# Patient Record
Sex: Male | Born: 1950
Health system: Southern US, Community
[De-identification: ages and names within clinical notes are randomized; demographics above are authoritative.]

## PROBLEM LIST (undated history)

## (undated) DIAGNOSIS — J449 Chronic obstructive pulmonary disease, unspecified: Secondary | ICD-10-CM

## (undated) DIAGNOSIS — E7800 Pure hypercholesterolemia, unspecified: Secondary | ICD-10-CM

## (undated) DIAGNOSIS — I10 Essential (primary) hypertension: Secondary | ICD-10-CM

## (undated) DIAGNOSIS — G4730 Sleep apnea, unspecified: Secondary | ICD-10-CM

## (undated) HISTORY — DX: Pure hypercholesterolemia, unspecified: E78.00

---

## 2000-08-06 ENCOUNTER — Ambulatory Visit: Admission: RE | Admit: 2000-08-06 | Discharge: 2000-08-06 | Payer: Self-pay | Admitting: Internal Medicine

## 2000-10-12 ENCOUNTER — Ambulatory Visit: Admission: RE | Admit: 2000-10-12 | Discharge: 2000-10-12 | Payer: Self-pay | Admitting: Pulmonary Disease

## 2003-01-19 ENCOUNTER — Ambulatory Visit (HOSPITAL_COMMUNITY): Admission: RE | Admit: 2003-01-19 | Discharge: 2003-01-19 | Payer: Self-pay | Admitting: Internal Medicine

## 2013-05-15 ENCOUNTER — Telehealth: Payer: Self-pay

## 2013-05-15 NOTE — Telephone Encounter (Signed)
Gastroenterology Pre-Procedure Review  Request Date: Requesting Physician: Dr. Willey Blade  PATIENT REVIEW QUESTIONS: The patient responded to the following health history questions as indicated:    1. Diabetes Melitis: NO 2. Joint replacements in the past 12 months: NO 3. Major health problems in the past 3 months: NO 4. Has an artificial valve or MVP: NO 5. Has a defibrillator: NO 6. Has been advised in past to take antibiotics in advance of a procedure like teeth cleaning: NO 7. No family history of colon cancer 8. Alcohol: 6 pack in a week  9. No bleeding or hemorrhoids  MEDICATIONS & ALLERGIES:     NKDA  Patient reports the following regarding taking any blood thinners:   Plavix? NO Aspirin? NO Coumadin? NO  Patient confirms/reports the following medications:  Current Outpatient Prescriptions  Medication Sig Dispense Refill  . amLODipine (NORVASC) 10 MG tablet Take 10 mg by mouth daily.      Marland Kitchen atenolol (TENORMIN) 25 MG tablet Take 25 mg by mouth daily.      . fenofibrate 160 MG tablet Take 160 mg by mouth daily.       No current facility-administered medications for this visit.    Patient confirms/reports the following allergies:  Allergies not on file  No orders of the defined types were placed in this encounter.    AUTHORIZATION INFORMATION Primary Insurance: [facility]  ID #: [id number]  Group #: [id number] Pre-Cert / Josem Kaufmann required:  Pre-Cert / Josem Kaufmann #:   SCHEDULE INFORMATION: Procedure has been scheduled as follows:  Date:  Time:  Location:  This Gastroenterology Pre-Precedure Review Form is being routed to the following provider(s):

## 2013-05-19 ENCOUNTER — Other Ambulatory Visit: Payer: Self-pay

## 2013-05-19 ENCOUNTER — Encounter (HOSPITAL_COMMUNITY): Payer: Self-pay | Admitting: Pharmacy Technician

## 2013-05-19 DIAGNOSIS — Z1211 Encounter for screening for malignant neoplasm of colon: Secondary | ICD-10-CM

## 2013-05-19 MED ORDER — PEG-KCL-NACL-NASULF-NA ASC-C 100 G PO SOLR: 1.0000 | ORAL | Status: DC

## 2013-05-19 NOTE — Telephone Encounter (Signed)
Ok to schedule.

## 2013-05-19 NOTE — Telephone Encounter (Signed)
Pt called and is scheduled for 05/26/2013 at 1:00 PM with Dr. Gala Romney. He is aware he will need to be there at 12:00 noon to register at [place]. Routing to Neil Crouch, PA to sign off on.

## 2013-05-20 ENCOUNTER — Telehealth: Payer: Self-pay

## 2013-05-20 MED ORDER — PEG-KCL-NACL-NASULF-NA ASC-C 100 G PO SOLR: 1.0000 | ORAL | Status: DC

## 2013-05-20 NOTE — Telephone Encounter (Signed)
I called [employer] at [phone] and spoke to Jesus G, who said that a PA is not required for a screening colonoscopy.   Reference # [id number].

## 2013-05-20 NOTE — Telephone Encounter (Signed)
Rx sent to the pharmacy and instructions faxed to pharmacy.

## 2013-05-26 ENCOUNTER — Ambulatory Visit (HOSPITAL_COMMUNITY)
Admission: RE | Admit: 2013-05-26 | Discharge: 2013-05-26 | Disposition: A | Discharge status: Home / Self Care | Payer: 59 | Source: Ambulatory Visit | Attending: Internal Medicine | Admitting: Internal Medicine

## 2013-05-26 ENCOUNTER — Encounter (HOSPITAL_COMMUNITY): Payer: Self-pay | Admitting: *Deleted

## 2013-05-26 ENCOUNTER — Encounter (HOSPITAL_COMMUNITY)
Admission: RE | Disposition: A | Discharge status: Home / Self Care | Payer: Self-pay | Source: Ambulatory Visit | Attending: Internal Medicine

## 2013-05-26 DIAGNOSIS — D126 Benign neoplasm of colon, unspecified: Secondary | ICD-10-CM | POA: Insufficient documentation

## 2013-05-26 DIAGNOSIS — Z79899 Other long term (current) drug therapy: Secondary | ICD-10-CM | POA: Insufficient documentation

## 2013-05-26 DIAGNOSIS — I10 Essential (primary) hypertension: Secondary | ICD-10-CM | POA: Insufficient documentation

## 2013-05-26 DIAGNOSIS — K5730 Diverticulosis of large intestine without perforation or abscess without bleeding: Secondary | ICD-10-CM | POA: Insufficient documentation

## 2013-05-26 DIAGNOSIS — Z1211 Encounter for screening for malignant neoplasm of colon: Secondary | ICD-10-CM | POA: Insufficient documentation

## 2013-05-26 HISTORY — PX: COLONOSCOPY: SHX5424

## 2013-05-26 HISTORY — DX: Essential (primary) hypertension: I10

## 2013-05-26 SURGERY — COLONOSCOPY
Anesthesia: Moderate Sedation

## 2013-05-26 MED ORDER — MIDAZOLAM HCL 5 MG/5ML IJ SOLN
INTRAMUSCULAR | Status: DC | PRN
  Administered 2013-05-26 (×2): 2 mg via INTRAVENOUS

## 2013-05-26 MED ORDER — ONDANSETRON HCL 4 MG/2ML IJ SOLN
INTRAMUSCULAR | Status: DC | PRN
  Administered 2013-05-26: 4 mg via INTRAVENOUS

## 2013-05-26 MED ORDER — MEPERIDINE HCL 100 MG/ML IJ SOLN
INTRAMUSCULAR | Status: DC | PRN
  Administered 2013-05-26: 50 mg via INTRAVENOUS
  Administered 2013-05-26: 25 mg via INTRAVENOUS

## 2013-05-26 MED ORDER — MIDAZOLAM HCL 5 MG/5ML IJ SOLN
INTRAMUSCULAR | Status: AC
  Filled 2013-05-26: qty 10

## 2013-05-26 MED ORDER — SIMETHICONE 40 MG/0.6ML PO SUSP
ORAL | Status: DC | PRN
  Administered 2013-05-26: 11:00:00

## 2013-05-26 MED ORDER — MEPERIDINE HCL 100 MG/ML IJ SOLN
INTRAMUSCULAR | Status: AC
  Filled 2013-05-26: qty 2

## 2013-05-26 MED ORDER — ONDANSETRON HCL 4 MG/2ML IJ SOLN
INTRAMUSCULAR | Status: AC
  Filled 2013-05-26: qty 2

## 2013-05-26 MED ORDER — SODIUM CHLORIDE 0.9 % IV SOLN
INTRAVENOUS | Status: DC
  Administered 2013-05-26: 1000 mL via INTRAVENOUS

## 2013-05-26 NOTE — Discharge Instructions (Addendum)
°Colonoscopy °Discharge Instructions ° °Read the instructions outlined below and refer to this sheet in the next few weeks. These discharge instructions provide you with general information on caring for yourself after you leave the hospital. Your doctor may also give you specific instructions. While your treatment has been planned according to the most current medical practices available, unavoidable complications occasionally occur. If you have any problems or questions after discharge, call Dr. Rourk at [phone]. °ACTIVITY °· You may resume your regular activity, but move at a slower pace for the next 24 hours.  °· Take frequent rest periods for the next 24 hours.  °· Walking will help get rid of the air and reduce the bloated feeling in your belly (abdomen).  °· No driving for 24 hours (because of the medicine (anesthesia) used during the test).   °· Do not sign any important legal documents or operate any machinery for 24 hours (because of the anesthesia used during the test).  °NUTRITION °· Drink plenty of fluids.  °· You may resume your normal diet as instructed by your doctor.  °· Begin with a light meal and progress to your normal diet. Heavy or fried foods are harder to digest and may make you feel sick to your stomach (nauseated).  °· Avoid alcoholic beverages for 24 hours or as instructed.  °MEDICATIONS °· You may resume your normal medications unless your doctor tells you otherwise.  °WHAT YOU CAN EXPECT TODAY °· Some feelings of bloating in the abdomen.  °· Passage of more gas than usual.  °· Spotting of blood in your stool or on the toilet paper.  °IF YOU HAD POLYPS REMOVED DURING THE COLONOSCOPY: °· No aspirin products for 7 days or as instructed.  °· No alcohol for 7 days or as instructed.  °· Eat a soft diet for the next 24 hours.  °FINDING OUT THE RESULTS OF YOUR TEST °Not all test results are available during your visit. If your test results are not back during the visit, make an appointment  with your caregiver to find out the results. Do not assume everything is normal if you have not heard from your caregiver or the medical facility. It is important for you to follow up on all of your test results.  °SEEK IMMEDIATE MEDICAL ATTENTION IF: °· You have more than a spotting of blood in your stool.  °· Your belly is swollen (abdominal distention).  °· You are nauseated or vomiting.  °· You have a temperature over 101.   °· You have abdominal pain or discomfort that is severe or gets worse throughout the day.  ° °. Diverticulosis and polyp information provided ° °Further recommendations to follow pending review of pathology report ° ° °Diverticulosis °Diverticulosis is a common condition that develops when small pouches (diverticula) form in the wall of the colon. The risk of diverticulosis increases with age. It happens more often in people who eat a low-fiber diet. Most individuals with diverticulosis have no symptoms. Those individuals with symptoms usually experience abdominal pain, constipation, or loose stools (diarrhea). °HOME CARE INSTRUCTIONS  °· Increase the amount of fiber in your diet as directed by your caregiver or dietician. This may reduce symptoms of diverticulosis. °· Your caregiver may recommend taking a dietary fiber supplement. °· Drink at least 6 to 8 glasses of water each day to prevent constipation. °· Try not to strain when you have a bowel movement. °· Your caregiver may recommend avoiding nuts and seeds to prevent complications, although   this is still an uncertain benefit. °· Only take over-the-counter or prescription medicines for pain, discomfort, or fever as directed by your caregiver. °FOODS WITH HIGH FIBER CONTENT INCLUDE: °· Fruits. Apple, peach, pear, tangerine, raisins, prunes. °· Vegetables. Brussels sprouts, asparagus, broccoli, cabbage, carrot, cauliflower, romaine lettuce, spinach, summer squash, tomato, winter squash, zucchini. °· Starchy Vegetables. Baked beans,  kidney beans, lima beans, split peas, lentils, potatoes (with skin). °· Grains. Whole wheat bread, brown rice, bran flake cereal, plain oatmeal, white rice, shredded wheat, bran muffins. °SEEK IMMEDIATE MEDICAL CARE IF:  °· You develop increasing pain or severe bloating. °· You have an oral temperature above 102° F (38.9° C), not controlled by medicine. °· You develop vomiting or bowel movements that are bloody or black. °Document Released: 12/09/2003 Document Revised: 06/05/2011 Document Reviewed: 08/11/2009 °[employer]® Patient Information ©2014 [employer]. °Colon Polyps °Polyps are lumps of extra tissue growing inside the body. Polyps can grow in the large intestine (colon). Most colon polyps are noncancerous (benign). However, some colon polyps can become cancerous over time. Polyps that are larger than a pea may be harmful. To be safe, caregivers remove and test all polyps. °CAUSES  °Polyps form when mutations in the genes cause your cells to grow and divide even though no more tissue is needed. °RISK FACTORS °There are a number of risk factors that can increase your chances of getting colon polyps. They include: °· Being older than 50 years. °· Family history of colon polyps or colon cancer. °· Long-term colon diseases, such as colitis or Crohn disease. °· Being overweight. °· Smoking. °· Being inactive. °· Drinking too much alcohol. °SYMPTOMS  °Most small polyps do not cause symptoms. If symptoms are present, they may include: °· Blood in the stool. The stool may look dark red or black. °· Constipation or diarrhea that lasts longer than 1 week. °DIAGNOSIS °People often do not know they have polyps until their caregiver finds them during a regular checkup. Your caregiver can use 4 tests to check for polyps: °· Digital rectal exam. The caregiver wears gloves and feels inside the rectum. This test would find polyps only in the rectum. °· Barium enema. The caregiver puts a liquid called barium into your rectum  before taking X-rays of your colon. Barium makes your colon look white. Polyps are dark, so they are easy to see in the X-ray pictures. °· Sigmoidoscopy. A thin, flexible tube (sigmoidoscope) is placed into your rectum. The sigmoidoscope has a light and tiny camera in it. The caregiver uses the sigmoidoscope to look at the last third of your colon. °· Colonoscopy. This test is like sigmoidoscopy, but the caregiver looks at the entire colon. This is the most common method for finding and removing polyps. °TREATMENT  °Any polyps will be removed during a sigmoidoscopy or colonoscopy. The polyps are then tested for cancer. °PREVENTION  °To help lower your risk of getting more colon polyps: °· Eat plenty of fruits and vegetables. Avoid eating fatty foods. °· Do not smoke. °· Avoid drinking alcohol. °· Exercise every day. °· Lose weight if recommended by your caregiver. °· Eat plenty of calcium and folate. Foods that are rich in calcium include milk, cheese, and broccoli. Foods that are rich in folate include chickpeas, kidney beans, and spinach. °HOME CARE INSTRUCTIONS °Keep all follow-up appointments as directed by your caregiver. You may need periodic exams to check for polyps. °SEEK MEDICAL CARE IF: °You notice bleeding during a bowel movement. °Document Released: 12/08/2003 Document Revised: 06/05/2011   Document Reviewed: 05/23/2011 [facility] Patient Information 2014 [place].

## 2013-05-26 NOTE — Op Note (Signed)
[place], [id number]   COLONOSCOPY PROCEDURE REPORT  PATIENT: Joseph Peterson, Joseph Peterson  MR#:         [id number] BIRTHDATE: [birth year] , 61  yrs. old GENDER: Male ENDOSCOPIST: R.  Garfield Cornea, MD FACP FACG REFERRED BY:  Asencion Noble, M.D. PROCEDURE DATE:  05/26/2013 PROCEDURE:     Ileocolonoscopy with snare polypectomy  INDICATIONS: Average risk colon cancer screening examination  INFORMED CONSENT:  The risks, benefits, alternatives and imponderables including but not limited to bleeding, perforation as well as the possibility of a missed lesion have been reviewed.  The potential for biopsy, lesion removal, etc. have also been discussed.  Questions have been answered.  All parties agreeable. Please see the history and physical in the medical record for more information.  MEDICATIONS: Versed 4 mg IV and Demerol 75 mg IV in divided doses. Zofran 4 mg IV  DESCRIPTION OF PROCEDURE:  After a digital rectal exam was performed, the EC-3890Li ([id number])  colonoscope was advanced from the anus through the rectum and colon to the area of the cecum, ileocecal valve and appendiceal orifice.  The cecum was deeply intubated.  These structures were well-seen and photographed for the record.  From the level of the cecum and ileocecal valve, the scope was slowly and cautiously withdrawn.  The mucosal surfaces were carefully surveyed utilizing scope tip deflection to facilitate fold flattening as needed.  The scope was pulled down into the rectum where a thorough examination including retroflexion was performed.    FINDINGS:  Adequate preparation. Normal rectum. Scattered left-sided diverticula; (2) 5 mm polyps in the base of the cecum; otherwise, the remainder of the colonic mucosa appeared normal. The distal 5 cm of terminal ileal mucosa also appeared normal.  THERAPEUTIC / DIAGNOSTIC MANEUVERS PERFORMED:  The above-mentioned polyps were cold snare  removed  COMPLICATIONS: None  CECAL WITHDRAWAL TIME:  14 minutes  IMPRESSION:  Colonic diverticulosis. Colonic polyps-removed as described above  RECOMMENDATIONS: Followup on pathology.   _______________________________ eSigned:  R. Garfield Cornea, MD FACP [facility] 05/26/2013 12:02 PM   CC:    PATIENT NAME:  Joseph Peterson, Joseph Peterson MR#: [id number]

## 2013-05-26 NOTE — H&P (Signed)
  Primary Care Physician:  Asencion Noble, MD Primary Gastroenterologist:  Dr. Gala Romney  Pre-Procedure History & Physical: HPI:  Joseph Peterson is a 63 y.o. male is here for a screening colonoscopy. Last colonoscopy 10 years ago-reportedly negative. No bowel symptoms. No family history of colon cancer or polyps.  Past Medical History  Diagnosis Date  . Hypertension     History reviewed. No pertinent past surgical history.  Prior to Admission medications   Medication Sig Start Date End Date Taking? Authorizing Provider  amLODipine (NORVASC) 10 MG tablet Take 10 mg by mouth daily.   Yes Historical Provider, MD  atenolol (TENORMIN) 25 MG tablet Take 25 mg by mouth daily.   Yes Historical Provider, MD  fenofibrate 160 MG tablet Take 160 mg by mouth daily.   Yes Historical Provider, MD  peg 3350 powder (MOVIPREP) 100 G SOLR Take 1 kit (200 g total) by mouth as directed. 05/20/13   Daneil Dolin, MD    Allergies as of 05/19/2013  . (No Known Allergies)    History reviewed. No pertinent family history.  History   Social History  . Marital Status: Married    Spouse Name: N/A    Number of Children: N/A  . Years of Education: N/A   Occupational History  . Not on file.   Social History Main Topics  . Smoking status: Former Research scientist (life sciences)  . Smokeless tobacco: Not on file  . Alcohol Use: Not on file  . Drug Use: Not on file  . Sexual Activity: Not on file   Other Topics Concern  . Not on file   Social History Narrative  . No narrative on file    Review of Systems: See HPI, otherwise negative ROS  Physical Exam: BP 131/72  Pulse 62  Temp(Src) 97.5 F (36.4 C) (Oral)  Resp 13  Ht $R'5\' 9"'qC$  (1.753 m)  Wt 240 lb (108.863 kg)  BMI 35.43 kg/m2  SpO2 94% General:   Alert,  Well-developed, well-nourished, pleasant and cooperative in NAD Head:  Normocephalic and atraumatic. Eyes:  Sclera clear, no icterus.   Conjunctiva pink. Ears:  Normal auditory acuity. Nose:  No deformity,  discharge,  or lesions. Mouth:  No deformity or lesions, dentition normal. Neck:  Supple; no masses or thyromegaly. Lungs:  Clear throughout to auscultation.   No wheezes, crackles, or rhonchi. No acute distress. Heart:  Regular rate and rhythm; no murmurs, clicks, rubs,  or gallops. Abdomen:  Obese. Positive bowel sounds soft and nontender without appreciable mass or megaly Msk:  Symmetrical without gross deformities. Normal posture. Pulses:  Normal pulses noted. Extremities:  Without clubbing or edema. Neurologic:  Alert and  oriented x4;  grossly normal neurologically. Skin:  Intact without significant lesions or rashes. Cervical Nodes:  No significant cervical adenopathy. Psych:  Alert and cooperative. Normal mood and affect.  Impression/Plan: [name] is now here to undergo a screening colonoscopy.  Average risk screening examination  Risks, benefits, limitations, imponderables and alternatives regarding colonoscopy have been reviewed with the patient. Questions have been answered. All parties agreeable.

## 2013-05-28 ENCOUNTER — Encounter: Payer: Self-pay | Admitting: Internal Medicine

## 2013-05-30 ENCOUNTER — Encounter (HOSPITAL_COMMUNITY): Payer: Self-pay | Admitting: Internal Medicine

## 2013-06-17 ENCOUNTER — Encounter: Payer: Self-pay | Admitting: Internal Medicine

## 2015-01-18 DIAGNOSIS — Z23 Encounter for immunization: Secondary | ICD-10-CM | POA: Diagnosis not present

## 2015-02-02 DIAGNOSIS — E785 Hyperlipidemia, unspecified: Secondary | ICD-10-CM | POA: Diagnosis not present

## 2015-02-02 DIAGNOSIS — Z79899 Other long term (current) drug therapy: Secondary | ICD-10-CM | POA: Diagnosis not present

## 2015-02-02 DIAGNOSIS — I10 Essential (primary) hypertension: Secondary | ICD-10-CM | POA: Diagnosis not present

## 2015-02-02 DIAGNOSIS — Z125 Encounter for screening for malignant neoplasm of prostate: Secondary | ICD-10-CM | POA: Diagnosis not present

## 2015-02-09 DIAGNOSIS — G4730 Sleep apnea, unspecified: Secondary | ICD-10-CM | POA: Diagnosis not present

## 2015-02-09 DIAGNOSIS — E785 Hyperlipidemia, unspecified: Secondary | ICD-10-CM | POA: Diagnosis not present

## 2015-02-09 DIAGNOSIS — Z6838 Body mass index (BMI) 38.0-38.9, adult: Secondary | ICD-10-CM | POA: Diagnosis not present

## 2015-02-09 DIAGNOSIS — I10 Essential (primary) hypertension: Secondary | ICD-10-CM | POA: Diagnosis not present

## 2015-11-09 DIAGNOSIS — H903 Sensorineural hearing loss, bilateral: Secondary | ICD-10-CM | POA: Diagnosis not present

## 2015-12-29 DIAGNOSIS — H5203 Hypermetropia, bilateral: Secondary | ICD-10-CM | POA: Diagnosis not present

## 2015-12-29 DIAGNOSIS — H524 Presbyopia: Secondary | ICD-10-CM | POA: Diagnosis not present

## 2015-12-29 DIAGNOSIS — H52223 Regular astigmatism, bilateral: Secondary | ICD-10-CM | POA: Diagnosis not present

## 2016-02-07 DIAGNOSIS — Z23 Encounter for immunization: Secondary | ICD-10-CM | POA: Diagnosis not present

## 2016-02-29 DIAGNOSIS — Z79899 Other long term (current) drug therapy: Secondary | ICD-10-CM | POA: Diagnosis not present

## 2016-02-29 DIAGNOSIS — I10 Essential (primary) hypertension: Secondary | ICD-10-CM | POA: Diagnosis not present

## 2016-02-29 DIAGNOSIS — Z125 Encounter for screening for malignant neoplasm of prostate: Secondary | ICD-10-CM | POA: Diagnosis not present

## 2016-02-29 DIAGNOSIS — E785 Hyperlipidemia, unspecified: Secondary | ICD-10-CM | POA: Diagnosis not present

## 2016-02-29 DIAGNOSIS — G4730 Sleep apnea, unspecified: Secondary | ICD-10-CM | POA: Diagnosis not present

## 2016-03-14 DIAGNOSIS — Z0001 Encounter for general adult medical examination with abnormal findings: Secondary | ICD-10-CM | POA: Diagnosis not present

## 2016-03-14 DIAGNOSIS — Z23 Encounter for immunization: Secondary | ICD-10-CM | POA: Diagnosis not present

## 2016-03-14 DIAGNOSIS — E785 Hyperlipidemia, unspecified: Secondary | ICD-10-CM | POA: Diagnosis not present

## 2016-03-14 DIAGNOSIS — I10 Essential (primary) hypertension: Secondary | ICD-10-CM | POA: Diagnosis not present

## 2016-09-15 DIAGNOSIS — I10 Essential (primary) hypertension: Secondary | ICD-10-CM | POA: Diagnosis not present

## 2017-01-25 DIAGNOSIS — Z23 Encounter for immunization: Secondary | ICD-10-CM | POA: Diagnosis not present

## 2017-03-09 DIAGNOSIS — Z79899 Other long term (current) drug therapy: Secondary | ICD-10-CM | POA: Diagnosis not present

## 2017-03-09 DIAGNOSIS — E785 Hyperlipidemia, unspecified: Secondary | ICD-10-CM | POA: Diagnosis not present

## 2017-03-09 DIAGNOSIS — G4733 Obstructive sleep apnea (adult) (pediatric): Secondary | ICD-10-CM | POA: Diagnosis not present

## 2017-03-09 DIAGNOSIS — I10 Essential (primary) hypertension: Secondary | ICD-10-CM | POA: Diagnosis not present

## 2017-03-09 DIAGNOSIS — Z125 Encounter for screening for malignant neoplasm of prostate: Secondary | ICD-10-CM | POA: Diagnosis not present

## 2017-03-16 DIAGNOSIS — Z0001 Encounter for general adult medical examination with abnormal findings: Secondary | ICD-10-CM | POA: Diagnosis not present

## 2017-03-16 DIAGNOSIS — E785 Hyperlipidemia, unspecified: Secondary | ICD-10-CM | POA: Diagnosis not present

## 2017-03-16 DIAGNOSIS — I10 Essential (primary) hypertension: Secondary | ICD-10-CM | POA: Diagnosis not present

## 2017-03-16 DIAGNOSIS — Z23 Encounter for immunization: Secondary | ICD-10-CM | POA: Diagnosis not present

## 2017-06-15 DIAGNOSIS — M25561 Pain in right knee: Secondary | ICD-10-CM | POA: Diagnosis not present

## 2017-06-15 DIAGNOSIS — I10 Essential (primary) hypertension: Secondary | ICD-10-CM | POA: Diagnosis not present

## 2017-06-26 DIAGNOSIS — M1711 Unilateral primary osteoarthritis, right knee: Secondary | ICD-10-CM | POA: Diagnosis not present

## 2017-08-21 DIAGNOSIS — M1711 Unilateral primary osteoarthritis, right knee: Secondary | ICD-10-CM | POA: Diagnosis not present

## 2017-08-31 DIAGNOSIS — I10 Essential (primary) hypertension: Secondary | ICD-10-CM | POA: Diagnosis not present

## 2017-08-31 DIAGNOSIS — M1712 Unilateral primary osteoarthritis, left knee: Secondary | ICD-10-CM | POA: Diagnosis not present

## 2017-12-27 DIAGNOSIS — Z23 Encounter for immunization: Secondary | ICD-10-CM | POA: Diagnosis not present

## 2018-01-04 DIAGNOSIS — I10 Essential (primary) hypertension: Secondary | ICD-10-CM | POA: Diagnosis not present

## 2018-03-04 DIAGNOSIS — H5203 Hypermetropia, bilateral: Secondary | ICD-10-CM | POA: Diagnosis not present

## 2018-03-04 DIAGNOSIS — H52223 Regular astigmatism, bilateral: Secondary | ICD-10-CM | POA: Diagnosis not present

## 2018-03-04 DIAGNOSIS — H524 Presbyopia: Secondary | ICD-10-CM | POA: Diagnosis not present

## 2018-04-15 DIAGNOSIS — M25561 Pain in right knee: Secondary | ICD-10-CM | POA: Diagnosis not present

## 2018-04-15 DIAGNOSIS — G8929 Other chronic pain: Secondary | ICD-10-CM | POA: Diagnosis not present

## 2018-04-15 DIAGNOSIS — M1711 Unilateral primary osteoarthritis, right knee: Secondary | ICD-10-CM | POA: Diagnosis not present

## 2018-04-16 ENCOUNTER — Other Ambulatory Visit: Payer: Self-pay | Admitting: Orthopedic Surgery

## 2018-04-16 DIAGNOSIS — M25561 Pain in right knee: Principal | ICD-10-CM

## 2018-04-16 DIAGNOSIS — G8929 Other chronic pain: Secondary | ICD-10-CM

## 2018-04-23 ENCOUNTER — Ambulatory Visit
Admission: RE | Admit: 2018-04-23 | Discharge: 2018-04-23 | Disposition: A | Discharge status: Home / Self Care | Payer: Medicare Other | Source: Ambulatory Visit | Attending: Orthopedic Surgery | Admitting: Orthopedic Surgery

## 2018-04-23 DIAGNOSIS — M25561 Pain in right knee: Secondary | ICD-10-CM | POA: Diagnosis not present

## 2018-04-23 DIAGNOSIS — G8929 Other chronic pain: Secondary | ICD-10-CM | POA: Insufficient documentation

## 2018-04-23 DIAGNOSIS — M1711 Unilateral primary osteoarthritis, right knee: Secondary | ICD-10-CM | POA: Diagnosis not present

## 2018-05-08 ENCOUNTER — Encounter: Payer: Self-pay | Admitting: Internal Medicine

## 2018-05-08 DIAGNOSIS — M1711 Unilateral primary osteoarthritis, right knee: Secondary | ICD-10-CM | POA: Diagnosis not present

## 2018-05-15 ENCOUNTER — Other Ambulatory Visit: Payer: Self-pay

## 2018-05-15 ENCOUNTER — Encounter
Admission: RE | Admit: 2018-05-15 | Discharge: 2018-05-15 | Disposition: A | Discharge status: Home / Self Care | Payer: Medicare Other | Source: Ambulatory Visit | Attending: Orthopedic Surgery | Admitting: Orthopedic Surgery

## 2018-05-15 DIAGNOSIS — Z01812 Encounter for preprocedural laboratory examination: Secondary | ICD-10-CM | POA: Insufficient documentation

## 2018-05-15 DIAGNOSIS — Z01810 Encounter for preprocedural cardiovascular examination: Secondary | ICD-10-CM | POA: Diagnosis not present

## 2018-05-15 HISTORY — DX: Chronic obstructive pulmonary disease, unspecified: J44.9

## 2018-05-15 LAB — CBC
HEMATOCRIT: 52.1 % — AB (ref 39.0–52.0)
Hemoglobin: 17.3 g/dL — ABNORMAL HIGH (ref 13.0–17.0)
MCH: 29.3 pg (ref 26.0–34.0)
MCHC: 33.2 g/dL (ref 30.0–36.0)
MCV: 88.3 fL (ref 80.0–100.0)
NRBC: 0 % (ref 0.0–0.2)
Platelets: 304 10*3/uL (ref 150–400)
RBC: 5.9 MIL/uL — AB (ref 4.22–5.81)
RDW: 13.3 % (ref 11.5–15.5)
WBC: 7.6 10*3/uL (ref 4.0–10.5)

## 2018-05-15 LAB — URINALYSIS, ROUTINE W REFLEX MICROSCOPIC
Bilirubin Urine: NEGATIVE
GLUCOSE, UA: NEGATIVE mg/dL
HGB URINE DIPSTICK: NEGATIVE
KETONES UR: NEGATIVE mg/dL
LEUKOCYTE UA: NEGATIVE
Nitrite: NEGATIVE
Protein, ur: NEGATIVE mg/dL
Specific Gravity, Urine: 1.017 (ref 1.005–1.030)
pH: 6 (ref 5.0–8.0)

## 2018-05-15 LAB — TYPE AND SCREEN
ABO/RH(D): A POS
Antibody Screen: NEGATIVE

## 2018-05-15 LAB — BASIC METABOLIC PANEL
ANION GAP: 10 (ref 5–15)
BUN: 17 mg/dL (ref 8–23)
CHLORIDE: 101 mmol/L (ref 98–111)
CO2: 27 mmol/L (ref 22–32)
Calcium: 9 mg/dL (ref 8.9–10.3)
Creatinine, Ser: 1.11 mg/dL (ref 0.61–1.24)
GFR calc Af Amer: >60 mL/min (ref >60)
GFR calc non Af Amer: >60 mL/min (ref >60)
Glucose, Bld: 135 mg/dL — ABNORMAL HIGH (ref 70–99)
POTASSIUM: 3.1 mmol/L — AB (ref 3.5–5.1)
Sodium: 138 mmol/L (ref 135–145)

## 2018-05-15 LAB — SEDIMENTATION RATE: Sed Rate: 2 mm/hr (ref 0–20)

## 2018-05-15 LAB — PROTIME-INR
INR: 0.95
PROTHROMBIN TIME: 12.6 s (ref 11.4–15.2)

## 2018-05-15 LAB — SURGICAL PCR SCREEN
MRSA, PCR: NEGATIVE
Staphylococcus aureus: NEGATIVE

## 2018-05-15 LAB — APTT: APTT: 29 s (ref 24–36)

## 2018-05-15 NOTE — Patient Instructions (Signed)
Your procedure is scheduled on: Tuesday 05/21/18.  Report to DAY SURGERY DEPARTMENT LOCATED ON 2ND FLOOR MEDICAL MALL ENTRANCE. To find out your arrival time please call [phone] between 1PM - 3PM on Monday 05/20/18.   Remember: Instructions that are not followed completely may result in serious medical risk, up to and including death, or upon the discretion of your surgeon and anesthesiologist your surgery may need to be rescheduled.      _X__ 1. Do not eat food after midnight the night before your procedure.                 No gum chewing or hard candies. You may drink clear liquids up to 2 hours                 before you are scheduled to arrive for your surgery- DO NOT drink clear                 liquids within 2 hours of the start of your surgery.                 Clear Liquids include:  water, apple juice without pulp, clear carbohydrate                 drink such as Clearfast or Gatorade, Black Coffee or Tea (Do not add                 milk or creamer to coffee or tea).   __X__2.  On the morning of surgery brush your teeth with toothpaste and water, you may rinse your mouth with mouthwash if you wish.  Do not swallow any toothpaste or mouthwash.     _X__ 3.  No Alcohol for 24 hours before or after surgery.   __X__4.  Notify your doctor if there is any change in your medical condition      (cold, fever, infections).     Do not wear jewelry, make-up, hairpins, clips or nail polish. Do not wear lotions, powders, or perfumes.  Do not shave 48 hours prior to surgery. Men may shave face and neck. Do not bring valuables to the hospital.    [facility] is not responsible for any belongings or valuables.  Contacts, dentures/partials or body piercings may not be worn into surgery. Bring a case for your contacts, glasses or hearing aids, a denture cup will be supplied.  Leave your suitcase in the car. After surgery it may be brought to your room.  For patients admitted to the  hospital, discharge time is determined by your treatment team.      Please read over the following fact sheets that you were given:   MRSA Information   __X__ Take ONLY these medicines the morning of surgery with A SIP OF WATER:     1. amLODipine (NORVASC) 10 MG tablet  2. fenofibrate 160 MG tablet    __X__ Use CHG Soap as directed    __X__ Stop Blood Thinners: Aspirin 3 days prior to your surgery. Your last dose will be on Friday 05/17/18.    __X__ Stop Anti-inflammatories 7 days before surgery such as Advil, Ibuprofen, Motrin, BC or Goodies Powder, Naprosyn, Naproxen, Aleve, Aspirin, Meloxicam. May take Tylenol if needed for pain or discomfort.    __X__ Do not begin taking any new herbal supplements before your procedure.

## 2018-05-16 LAB — URINE CULTURE: Culture: NO GROWTH

## 2018-05-16 NOTE — Pre-Procedure Instructions (Signed)
Kt faxed to dr Rudene Christians. Will recheck am surgery

## 2018-05-21 ENCOUNTER — Inpatient Hospital Stay: Payer: Medicare Other | Admitting: Anesthesiology

## 2018-05-21 ENCOUNTER — Encounter
Admission: RE | Disposition: A | Discharge status: Home Health | Payer: Self-pay | Source: Ambulatory Visit | Attending: Orthopedic Surgery

## 2018-05-21 ENCOUNTER — Inpatient Hospital Stay
Admission: RE | Admit: 2018-05-21 | Discharge: 2018-05-23 | DRG: 470 | Disposition: A | Discharge status: Home Health | Payer: Medicare Other | Source: Ambulatory Visit | Attending: Orthopedic Surgery | Admitting: Orthopedic Surgery

## 2018-05-21 ENCOUNTER — Other Ambulatory Visit: Payer: Self-pay

## 2018-05-21 ENCOUNTER — Inpatient Hospital Stay: Payer: Medicare Other

## 2018-05-21 DIAGNOSIS — Z96651 Presence of right artificial knee joint: Secondary | ICD-10-CM

## 2018-05-21 DIAGNOSIS — M1711 Unilateral primary osteoarthritis, right knee: Principal | ICD-10-CM | POA: Diagnosis present

## 2018-05-21 DIAGNOSIS — G4730 Sleep apnea, unspecified: Secondary | ICD-10-CM | POA: Diagnosis present

## 2018-05-21 DIAGNOSIS — J449 Chronic obstructive pulmonary disease, unspecified: Secondary | ICD-10-CM | POA: Diagnosis not present

## 2018-05-21 DIAGNOSIS — I10 Essential (primary) hypertension: Secondary | ICD-10-CM | POA: Diagnosis not present

## 2018-05-21 DIAGNOSIS — E876 Hypokalemia: Secondary | ICD-10-CM | POA: Diagnosis not present

## 2018-05-21 DIAGNOSIS — M25561 Pain in right knee: Secondary | ICD-10-CM | POA: Diagnosis not present

## 2018-05-21 DIAGNOSIS — Z8249 Family history of ischemic heart disease and other diseases of the circulatory system: Secondary | ICD-10-CM

## 2018-05-21 DIAGNOSIS — E785 Hyperlipidemia, unspecified: Secondary | ICD-10-CM | POA: Diagnosis present

## 2018-05-21 DIAGNOSIS — Z7982 Long term (current) use of aspirin: Secondary | ICD-10-CM | POA: Diagnosis not present

## 2018-05-21 DIAGNOSIS — G8918 Other acute postprocedural pain: Secondary | ICD-10-CM

## 2018-05-21 DIAGNOSIS — Z471 Aftercare following joint replacement surgery: Secondary | ICD-10-CM | POA: Diagnosis not present

## 2018-05-21 HISTORY — DX: Presence of right artificial knee joint: Z96.651

## 2018-05-21 HISTORY — PX: TOTAL KNEE ARTHROPLASTY: SHX125

## 2018-05-21 LAB — CREATININE, SERUM
CREATININE: 0.94 mg/dL (ref 0.61–1.24)
GFR calc Af Amer: >60 mL/min (ref >60)
GFR calc non Af Amer: >60 mL/min (ref >60)

## 2018-05-21 LAB — CBC
HCT: 45.9 % (ref 39.0–52.0)
Hemoglobin: 14.9 g/dL (ref 13.0–17.0)
MCH: 29.2 pg (ref 26.0–34.0)
MCHC: 32.5 g/dL (ref 30.0–36.0)
MCV: 89.8 fL (ref 80.0–100.0)
Platelets: 266 10*3/uL (ref 150–400)
RBC: 5.11 MIL/uL (ref 4.22–5.81)
RDW: 13.4 % (ref 11.5–15.5)
WBC: 9 10*3/uL (ref 4.0–10.5)
nRBC: 0 % (ref 0.0–0.2)

## 2018-05-21 LAB — POCT I-STAT 4, (NA,K, GLUC, HGB,HCT)
GLUCOSE: 106 mg/dL — AB (ref 70–99)
HCT: 51 % (ref 39.0–52.0)
Hemoglobin: 17.3 g/dL — ABNORMAL HIGH (ref 13.0–17.0)
Potassium: 4.4 mmol/L (ref 3.5–5.1)
Sodium: 143 mmol/L (ref 135–145)

## 2018-05-21 LAB — ABO/RH: ABO/RH(D): A POS

## 2018-05-21 SURGERY — ARTHROPLASTY, KNEE, TOTAL
Anesthesia: Spinal | Site: Knee | Laterality: Right

## 2018-05-21 MED ORDER — MIDAZOLAM HCL 5 MG/5ML IJ SOLN
INTRAMUSCULAR | Status: DC | PRN
  Administered 2018-05-21: 2 mg via INTRAVENOUS

## 2018-05-21 MED ORDER — HYDROCODONE-ACETAMINOPHEN 5-325 MG PO TABS
1.0000 | ORAL_TABLET | ORAL | Status: DC | PRN
  Administered 2018-05-21 (×3): 1 via ORAL
  Administered 2018-05-22: 2 via ORAL
  Filled 2018-05-21 (×2): qty 1
  Filled 2018-05-21: qty 2
  Filled 2018-05-21: qty 1

## 2018-05-21 MED ORDER — BUPIVACAINE-EPINEPHRINE (PF) 0.25% -1:200000 IJ SOLN
INTRAMUSCULAR | Status: AC
  Filled 2018-05-21: qty 30

## 2018-05-21 MED ORDER — BUPIVACAINE HCL (PF) 0.5 % IJ SOLN
INTRAMUSCULAR | Status: AC
  Filled 2018-05-21: qty 10

## 2018-05-21 MED ORDER — ONDANSETRON HCL 4 MG PO TABS: 4.0000 mg | ORAL_TABLET | Freq: Four times a day (QID) | ORAL | Status: DC | PRN

## 2018-05-21 MED ORDER — FAMOTIDINE 20 MG PO TABS
ORAL_TABLET | ORAL | Status: AC
  Administered 2018-05-21: 20 mg
  Filled 2018-05-21: qty 1

## 2018-05-21 MED ORDER — MIDAZOLAM HCL 2 MG/2ML IJ SOLN
INTRAMUSCULAR | Status: AC
  Filled 2018-05-21: qty 2

## 2018-05-21 MED ORDER — ACETAMINOPHEN 325 MG PO TABS: 325.0000 mg | ORAL_TABLET | Freq: Four times a day (QID) | ORAL | Status: DC | PRN

## 2018-05-21 MED ORDER — MORPHINE SULFATE (PF) 2 MG/ML IV SOLN: 0.5000 mg | INTRAVENOUS | Status: DC | PRN

## 2018-05-21 MED ORDER — DOCUSATE SODIUM 100 MG PO CAPS
100.0000 mg | ORAL_CAPSULE | Freq: Two times a day (BID) | ORAL | Status: DC
  Administered 2018-05-21 – 2018-05-22 (×3): 100 mg via ORAL
  Filled 2018-05-21 (×3): qty 1

## 2018-05-21 MED ORDER — ONDANSETRON HCL 4 MG/2ML IJ SOLN: 4.0000 mg | Freq: Once | INTRAMUSCULAR | Status: DC | PRN

## 2018-05-21 MED ORDER — BUPIVACAINE LIPOSOME 1.3 % IJ SUSP
INTRAMUSCULAR | Status: AC
  Filled 2018-05-21: qty 20

## 2018-05-21 MED ORDER — MAGNESIUM HYDROXIDE 400 MG/5ML PO SUSP: 30.0000 mL | Freq: Every day | ORAL | Status: DC | PRN

## 2018-05-21 MED ORDER — SODIUM CHLORIDE 0.9 % IV SOLN
INTRAVENOUS | Status: DC
  Administered 2018-05-21 – 2018-05-22 (×2): via INTRAVENOUS

## 2018-05-21 MED ORDER — HYDROCODONE-ACETAMINOPHEN 7.5-325 MG PO TABS
1.0000 | ORAL_TABLET | ORAL | Status: DC | PRN
  Administered 2018-05-22 – 2018-05-23 (×2): 2 via ORAL
  Filled 2018-05-21 (×2): qty 2

## 2018-05-21 MED ORDER — ACETAMINOPHEN 10 MG/ML IV SOLN
INTRAVENOUS | Status: DC | PRN
  Administered 2018-05-21: 1000 mg via INTRAVENOUS

## 2018-05-21 MED ORDER — METOCLOPRAMIDE HCL 5 MG/ML IJ SOLN: 5.0000 mg | Freq: Three times a day (TID) | INTRAMUSCULAR | Status: DC | PRN

## 2018-05-21 MED ORDER — DEXMEDETOMIDINE HCL IN NACL 80 MCG/20ML IV SOLN
INTRAVENOUS | Status: AC
  Filled 2018-05-21: qty 20

## 2018-05-21 MED ORDER — HYDROCHLOROTHIAZIDE 25 MG PO TABS
25.0000 mg | ORAL_TABLET | Freq: Every day | ORAL | Status: DC
  Administered 2018-05-22 – 2018-05-23 (×2): 25 mg via ORAL
  Filled 2018-05-21 (×2): qty 1

## 2018-05-21 MED ORDER — BUPIVACAINE-EPINEPHRINE (PF) 0.25% -1:200000 IJ SOLN
INTRAMUSCULAR | Status: DC | PRN
  Administered 2018-05-21: 30 mL

## 2018-05-21 MED ORDER — ALUM & MAG HYDROXIDE-SIMETH 200-200-20 MG/5ML PO SUSP: 30.0000 mL | ORAL | Status: DC | PRN

## 2018-05-21 MED ORDER — DIPHENHYDRAMINE HCL 50 MG/ML IJ SOLN
INTRAMUSCULAR | Status: AC
  Filled 2018-05-21: qty 1

## 2018-05-21 MED ORDER — PROPOFOL 500 MG/50ML IV EMUL
INTRAVENOUS | Status: AC
  Filled 2018-05-21: qty 50

## 2018-05-21 MED ORDER — METHOCARBAMOL 500 MG PO TABS
500.0000 mg | ORAL_TABLET | Freq: Four times a day (QID) | ORAL | Status: DC | PRN
  Administered 2018-05-21: 500 mg via ORAL
  Filled 2018-05-21: qty 1

## 2018-05-21 MED ORDER — BUPIVACAINE HCL (PF) 0.5 % IJ SOLN
INTRAMUSCULAR | Status: DC | PRN
  Administered 2018-05-21: 3 mL

## 2018-05-21 MED ORDER — MORPHINE SULFATE (PF) 10 MG/ML IV SOLN
INTRAVENOUS | Status: AC
  Filled 2018-05-21: qty 1

## 2018-05-21 MED ORDER — MORPHINE SULFATE 10 MG/ML IJ SOLN
INTRAMUSCULAR | Status: DC | PRN
  Administered 2018-05-21: 10 mg

## 2018-05-21 MED ORDER — MENTHOL 3 MG MT LOZG
1.0000 | LOZENGE | OROMUCOSAL | Status: DC | PRN
  Filled 2018-05-21: qty 9

## 2018-05-21 MED ORDER — FENTANYL CITRATE (PF) 100 MCG/2ML IJ SOLN: 25.0000 ug | INTRAMUSCULAR | Status: DC | PRN

## 2018-05-21 MED ORDER — ACETAMINOPHEN 10 MG/ML IV SOLN
INTRAVENOUS | Status: AC
  Filled 2018-05-21: qty 100

## 2018-05-21 MED ORDER — AMLODIPINE BESYLATE 10 MG PO TABS
10.0000 mg | ORAL_TABLET | Freq: Every day | ORAL | Status: DC
  Administered 2018-05-22 – 2018-05-23 (×2): 10 mg via ORAL
  Filled 2018-05-21: qty 1
  Filled 2018-05-21: qty 2

## 2018-05-21 MED ORDER — PHENOL 1.4 % MT LIQD
1.0000 | OROMUCOSAL | Status: DC | PRN
  Filled 2018-05-21: qty 177

## 2018-05-21 MED ORDER — PROPOFOL 10 MG/ML IV BOLUS
INTRAVENOUS | Status: AC
  Filled 2018-05-21: qty 20

## 2018-05-21 MED ORDER — ENOXAPARIN SODIUM 30 MG/0.3ML ~~LOC~~ SOLN
30.0000 mg | Freq: Two times a day (BID) | SUBCUTANEOUS | Status: DC
  Administered 2018-05-22 – 2018-05-23 (×3): 30 mg via SUBCUTANEOUS
  Filled 2018-05-21 (×3): qty 0.3

## 2018-05-21 MED ORDER — SODIUM CHLORIDE (PF) 0.9 % IJ SOLN
INTRAMUSCULAR | Status: AC
  Filled 2018-05-21: qty 100

## 2018-05-21 MED ORDER — DEXMEDETOMIDINE HCL 200 MCG/2ML IV SOLN
INTRAVENOUS | Status: DC | PRN
  Administered 2018-05-21: 24 ug via INTRAVENOUS
  Administered 2018-05-21 (×2): 12 ug via INTRAVENOUS

## 2018-05-21 MED ORDER — GLYCOPYRROLATE 0.2 MG/ML IJ SOLN
INTRAMUSCULAR | Status: AC
  Filled 2018-05-21: qty 1

## 2018-05-21 MED ORDER — ONDANSETRON HCL 4 MG/2ML IJ SOLN: 4.0000 mg | Freq: Four times a day (QID) | INTRAMUSCULAR | Status: DC | PRN

## 2018-05-21 MED ORDER — LIDOCAINE HCL URETHRAL/MUCOSAL 2 % EX GEL
CUTANEOUS | Status: AC
  Filled 2018-05-21: qty 5

## 2018-05-21 MED ORDER — FENOFIBRATE 160 MG PO TABS
160.0000 mg | ORAL_TABLET | Freq: Every day | ORAL | Status: DC
  Administered 2018-05-22 – 2018-05-23 (×2): 160 mg via ORAL
  Filled 2018-05-21 (×3): qty 1

## 2018-05-21 MED ORDER — DIPHENHYDRAMINE HCL 50 MG/ML IJ SOLN
INTRAMUSCULAR | Status: DC | PRN
  Administered 2018-05-21: 25 mg via INTRAVENOUS

## 2018-05-21 MED ORDER — MAGNESIUM CITRATE PO SOLN
1.0000 | Freq: Once | ORAL | Status: DC | PRN
  Filled 2018-05-21: qty 296

## 2018-05-21 MED ORDER — GLYCOPYRROLATE 0.2 MG/ML IJ SOLN
INTRAMUSCULAR | Status: DC | PRN
  Administered 2018-05-21: 0.2 mg via INTRAVENOUS

## 2018-05-21 MED ORDER — PROPOFOL 10 MG/ML IV BOLUS
INTRAVENOUS | Status: DC | PRN
  Administered 2018-05-21: 50 mg via INTRAVENOUS

## 2018-05-21 MED ORDER — METOCLOPRAMIDE HCL 10 MG PO TABS: 5.0000 mg | ORAL_TABLET | Freq: Three times a day (TID) | ORAL | Status: DC | PRN

## 2018-05-21 MED ORDER — CEFAZOLIN SODIUM-DEXTROSE 2-4 GM/100ML-% IV SOLN
INTRAVENOUS | Status: AC
  Filled 2018-05-21: qty 100

## 2018-05-21 MED ORDER — FAMOTIDINE 20 MG PO TABS: 20.0000 mg | ORAL_TABLET | Freq: Once | ORAL | Status: DC

## 2018-05-21 MED ORDER — CEFAZOLIN SODIUM-DEXTROSE 2-4 GM/100ML-% IV SOLN
2.0000 g | Freq: Once | INTRAVENOUS | Status: AC
  Administered 2018-05-21: 2 g via INTRAVENOUS

## 2018-05-21 MED ORDER — LACTATED RINGERS IV SOLN
INTRAVENOUS | Status: DC
  Administered 2018-05-21: 09:00:00 via INTRAVENOUS

## 2018-05-21 MED ORDER — GENTAMICIN SULFATE 40 MG/ML IJ SOLN
INTRAMUSCULAR | Status: AC
  Filled 2018-05-21: qty 8

## 2018-05-21 MED ORDER — OXYMETAZOLINE HCL 0.05 % NA SOLN
NASAL | Status: AC
  Filled 2018-05-21: qty 30

## 2018-05-21 MED ORDER — SODIUM CHLORIDE 0.9 % IV SOLN
INTRAVENOUS | Status: DC | PRN
  Administered 2018-05-21: 60 mL

## 2018-05-21 MED ORDER — ZOLPIDEM TARTRATE 5 MG PO TABS: 5.0000 mg | ORAL_TABLET | Freq: Every evening | ORAL | Status: DC | PRN

## 2018-05-21 MED ORDER — ASPIRIN EC 81 MG PO TBEC
81.0000 mg | DELAYED_RELEASE_TABLET | Freq: Every day | ORAL | Status: DC
  Administered 2018-05-22 – 2018-05-23 (×2): 81 mg via ORAL
  Filled 2018-05-21 (×2): qty 1

## 2018-05-21 MED ORDER — PROPOFOL 500 MG/50ML IV EMUL
INTRAVENOUS | Status: DC | PRN
  Administered 2018-05-21: 50 ug/kg/min via INTRAVENOUS

## 2018-05-21 MED ORDER — TRAMADOL HCL 50 MG PO TABS
50.0000 mg | ORAL_TABLET | Freq: Four times a day (QID) | ORAL | Status: DC
  Administered 2018-05-21 – 2018-05-23 (×8): 50 mg via ORAL
  Filled 2018-05-21 (×8): qty 1

## 2018-05-21 MED ORDER — TRANEXAMIC ACID-NACL 1000-0.7 MG/100ML-% IV SOLN
1000.0000 mg | INTRAVENOUS | Status: AC
  Administered 2018-05-21: 1000 mg via INTRAVENOUS
  Filled 2018-05-21: qty 100

## 2018-05-21 MED ORDER — METHOCARBAMOL 1000 MG/10ML IJ SOLN
500.0000 mg | Freq: Four times a day (QID) | INTRAVENOUS | Status: DC | PRN
  Filled 2018-05-21: qty 5

## 2018-05-21 MED ORDER — CEFAZOLIN SODIUM-DEXTROSE 2-4 GM/100ML-% IV SOLN
2.0000 g | Freq: Four times a day (QID) | INTRAVENOUS | Status: AC
  Administered 2018-05-21 (×2): 2 g via INTRAVENOUS
  Filled 2018-05-21: qty 2
  Filled 2018-05-21 (×2): qty 100
  Filled 2018-05-21: qty 2

## 2018-05-21 MED ORDER — ACETAMINOPHEN 500 MG PO TABS
500.0000 mg | ORAL_TABLET | Freq: Four times a day (QID) | ORAL | Status: AC
  Administered 2018-05-21 – 2018-05-22 (×4): 500 mg via ORAL
  Filled 2018-05-21 (×4): qty 1

## 2018-05-21 MED ORDER — DIPHENHYDRAMINE HCL 12.5 MG/5ML PO ELIX: 12.5000 mg | ORAL_SOLUTION | ORAL | Status: DC | PRN

## 2018-05-21 MED ORDER — SODIUM CHLORIDE 0.9 % IV SOLN
INTRAVENOUS | Status: DC | PRN
  Administered 2018-05-21: 3500 mL

## 2018-05-21 MED ORDER — BISACODYL 5 MG PO TBEC
5.0000 mg | DELAYED_RELEASE_TABLET | Freq: Every day | ORAL | Status: DC | PRN
  Administered 2018-05-22: 5 mg via ORAL
  Filled 2018-05-21: qty 1

## 2018-05-21 SURGICAL SUPPLY — 67 items
BANDAGE ACE 6X5 VEL STRL LF (GAUZE/BANDAGES/DRESSINGS) ×3 IMPLANT
BLADE SAW 1 (BLADE) ×3 IMPLANT
BLADE SAW 90X25X1.19 OSCILLAT (BLADE) ×2 IMPLANT
BLOCK CUTTING FEMUR 5 RT MED (MISCELLANEOUS) ×2 IMPLANT
BLOCK CUTTING TIBIAL 4 RT MIS (MISCELLANEOUS) ×2 IMPLANT
BLOCK CUTTING TIBIAL 5 RT (MISCELLANEOUS) ×2 IMPLANT
CANISTER SUCT 1200ML W/VALVE (MISCELLANEOUS) ×3 IMPLANT
CANISTER SUCT 3000ML PPV (MISCELLANEOUS) ×6 IMPLANT
CEMENT HV SMART SET (Cement) ×6 IMPLANT
CHLORAPREP W/TINT 26ML (MISCELLANEOUS) ×6 IMPLANT
COOLER POLAR GLACIER W/PUMP (MISCELLANEOUS) ×3 IMPLANT
COVER WAND RF STERILE (DRAPES) ×3 IMPLANT
CUFF TOURN 24 STER (MISCELLANEOUS) IMPLANT
CUFF TOURN 30 STER DUAL PORT (MISCELLANEOUS) IMPLANT
DRAPE SHEET LG 3/4 BI-LAMINATE (DRAPES) ×6 IMPLANT
ELECT CAUTERY BLADE 6.4 (BLADE) ×3 IMPLANT
ELECT REM PT RETURN 9FT ADLT (ELECTROSURGICAL) ×3
ELECTRODE REM PT RTRN 9FT ADLT (ELECTROSURGICAL) ×1 IMPLANT
FEMORAL COMP CEMENTED SZ5 (Femur) ×2 IMPLANT
FEMUR BONE MODEL (MISCELLANEOUS) ×2 IMPLANT
GAUZE PETRO XEROFOAM 1X8 (MISCELLANEOUS) ×3 IMPLANT
GAUZE SPONGE 4X4 12PLY STRL (GAUZE/BANDAGES/DRESSINGS) ×3 IMPLANT
GLOVE BIOGEL PI IND STRL 9 (GLOVE) ×1 IMPLANT
GLOVE BIOGEL PI INDICATOR 9 (GLOVE) ×2
GLOVE INDICATOR 8.0 STRL GRN (GLOVE) ×3 IMPLANT
GLOVE SURG ORTHO 8.0 STRL STRW (GLOVE) ×3 IMPLANT
GLOVE SURG SYN 9.0  PF PI (GLOVE) ×2
GLOVE SURG SYN 9.0 PF PI (GLOVE) ×1 IMPLANT
GOWN SRG 2XL LVL 4 RGLN SLV (GOWNS) ×1 IMPLANT
GOWN STRL NON-REIN 2XL LVL4 (GOWNS) ×2
GOWN STRL REUS W/ TWL LRG LVL3 (GOWN DISPOSABLE) ×1 IMPLANT
GOWN STRL REUS W/ TWL XL LVL3 (GOWN DISPOSABLE) ×1 IMPLANT
GOWN STRL REUS W/TWL LRG LVL3 (GOWN DISPOSABLE) ×2
GOWN STRL REUS W/TWL XL LVL3 (GOWN DISPOSABLE) ×2
HOLDER FOLEY CATH W/STRAP (MISCELLANEOUS) ×3 IMPLANT
HOOD PEEL AWAY FLYTE STAYCOOL (MISCELLANEOUS) ×6 IMPLANT
KIT TURNOVER KIT A (KITS) ×3 IMPLANT
NDL SAFETY ECLIPSE 18X1.5 (NEEDLE) ×1 IMPLANT
NDL SPNL 18GX3.5 QUINCKE PK (NEEDLE) ×1 IMPLANT
NDL SPNL 20GX3.5 QUINCKE YW (NEEDLE) ×1 IMPLANT
NEEDLE HYPO 18GX1.5 SHARP (NEEDLE) ×2
NEEDLE SPNL 18GX3.5 QUINCKE PK (NEEDLE) ×3 IMPLANT
NEEDLE SPNL 20GX3.5 QUINCKE YW (NEEDLE) ×3 IMPLANT
NS IRRIG 1000ML POUR BTL (IV SOLUTION) ×3 IMPLANT
PACK TOTAL KNEE (MISCELLANEOUS) ×3 IMPLANT
PAD WRAPON POLAR KNEE (MISCELLANEOUS) ×1 IMPLANT
PATELLA RESURFACING MEDACTA SZ (Bone Implant) ×2 IMPLANT
PULSAVAC PLUS IRRIG FAN TIP (DISPOSABLE) ×3
SCALPEL PROTECTED #10 DISP (BLADE) ×6 IMPLANT
SOL .9 NS 3000ML IRR  AL (IV SOLUTION) ×2
SOL .9 NS 3000ML IRR UROMATIC (IV SOLUTION) ×1 IMPLANT
STAPLER SKIN PROX 35W (STAPLE) ×3 IMPLANT
STEM EXTENSION 11MMX30MM (Stem) ×2 IMPLANT
SUCTION FRAZIER HANDLE 10FR (MISCELLANEOUS) ×2
SUCTION TUBE FRAZIER 10FR DISP (MISCELLANEOUS) ×1 IMPLANT
SUT DVC 2 QUILL PDO  T11 36X36 (SUTURE) ×2
SUT DVC 2 QUILL PDO T11 36X36 (SUTURE) ×1 IMPLANT
SUT V-LOC 90 ABS DVC 3-0 CL (SUTURE) ×3 IMPLANT
SYR 20CC LL (SYRINGE) ×3 IMPLANT
SYR 50ML LL SCALE MARK (SYRINGE) ×6 IMPLANT
TIBIAL INSERT FIXED SZ5 RIGHT (Insert) ×2 IMPLANT
TIBIAL TRAY FIXED (Bone Implant) ×2 IMPLANT
TIP FAN IRRIG PULSAVAC PLUS (DISPOSABLE) ×1 IMPLANT
TOWEL OR 17X26 4PK STRL BLUE (TOWEL DISPOSABLE) ×3 IMPLANT
TOWER CARTRIDGE SMART MIX (DISPOSABLE) ×3 IMPLANT
TRAY FOLEY MTR SLVR 16FR STAT (SET/KITS/TRAYS/PACK) ×3 IMPLANT
WRAPON POLAR PAD KNEE (MISCELLANEOUS) ×3

## 2018-05-21 NOTE — Evaluation (Signed)
Physical Therapy Evaluation Patient Details Name: Joseph Peterson MRN: [id number] DOB: Nov 17, 1950 Today's Date: 05/21/2018   History of Present Illness  68 y/o male s/p R TKA 05/21/18.  Clinical Impression  Pt did very well with POD0 PT assessment.  He was able to do SLRs, had AROM to 70s and PROM to 80, was able to ambulate ~35 ft with good cadence and confidence and overall he is making good progress and has achieved typical POD1 or 2 goals.  He had some expected pain, but even with ROM tasks reports only 5or6/10.  Pt tolerated ~15 minutes of exercises and did well with ~10 minutes of mobility and gait training all with great effort and performance.      Follow Up Recommendations Home health PT    Equipment Recommendations  Rolling walker with 5" wheels    Recommendations for Other Services       Precautions / Restrictions Precautions Precautions: Fall Restrictions Weight Bearing Restrictions: Yes RLE Weight Bearing: Weight bearing as tolerated      Mobility  Bed Mobility Overal bed mobility: Independent             General bed mobility comments: Pt generally did well with getting to EOB, slowly and with extra effort, but no assist  Transfers Overall transfer level: Independent Equipment used: Rolling walker (2 wheeled)             General transfer comment: able to rise from low height bed with heavy UE use, but no physical assist  Ambulation/Gait Ambulation/Gait assistance: Supervision Gait Distance (Feet): 35 Feet Assistive device: Rolling walker (2 wheeled)       General Gait Details: Pt did very well with ambulation, he showed very little hesitation with WBing and was able to maintain consistent walker motion with guarding and was safe and confident t/o the effort.   Stairs            Wheelchair Mobility    Modified Rankin (Stroke Patients Only)       Balance Overall balance assessment: Independent                                            Pertinent Vitals/Pain Pain Assessment: 0-10 Pain Score: 3 (increases to 6/10 with ROM tasks) Pain Location: R knee    Home Living Family/patient expects to be discharged to:: Private residence Living Arrangements: Spouse/significant other Available Help at Discharge: Family;Available 24 hours/day   Home Access: Stairs to enter Entrance Stairs-Rails: Can reach both Entrance Stairs-Number of Steps: 3 Home Layout: One level Home Equipment: None      Prior Function Level of Independence: Independent         Comments: Pt independent with mobility, community activity, and exercises regularly     Hand Dominance        Extremity/Trunk Assessment   Upper Extremity Assessment Upper Extremity Assessment: Overall WFL for tasks assessed    Lower Extremity Assessment Lower Extremity Assessment: Overall WFL for tasks assessed(expected R LE post-op weakness)       Communication   Communication: No difficulties  Cognition Arousal/Alertness: Awake/alert Behavior During Therapy: WFL for tasks assessed/performed Overall Cognitive Status: Within Functional Limits for tasks assessed  General Comments      Exercises Total Joint Exercises Ankle Circles/Pumps: AROM;10 reps Quad Sets: Strengthening;10 reps Short Arc Quad: AROM;10 reps Heel Slides: AROM;10 reps(resisted leg extension) Hip ABduction/ADduction: Strengthening;10 reps Straight Leg Raises: AROM;10 reps Knee Flexion: PROM;5 reps Goniometric ROM: 1-85   Assessment/Plan    PT Assessment Patient needs continued PT services  PT Problem List Decreased strength;Decreased mobility;Decreased range of motion;Decreased activity tolerance;Decreased balance;Decreased knowledge of use of DME;Decreased safety awareness;Pain       PT Treatment Interventions Gait training;DME instruction;Stair training;Functional mobility training;Therapeutic  activities;Therapeutic exercise;Balance training;Neuromuscular re-education;Patient/family education    PT Goals (Current goals can be found in the Care Plan section)  Acute Rehab PT Goals Patient Stated Goal: get back to normal PT Goal Formulation: With patient Time For Goal Achievement: 06/04/18 Potential to Achieve Goals: Good    Frequency BID   Barriers to discharge        Co-evaluation               AM-PAC PT "6 Clicks" Mobility  Outcome Measure Help needed turning from your back to your side while in a flat bed without using bedrails?: None Help needed moving from lying on your back to sitting on the side of a flat bed without using bedrails?: None Help needed moving to and from a bed to a chair (including a wheelchair)?: None Help needed standing up from a chair using your arms (e.g., wheelchair or bedside chair)?: None Help needed to walk in hospital room?: None Help needed climbing 3-5 steps with a railing? : A Little 6 Click Score: 23    End of Session Equipment Utilized During Treatment: Gait belt;Oxygen(2L) Activity Tolerance: Patient tolerated treatment well Patient left: with call bell/phone within reach;with chair alarm set Nurse Communication: Mobility status PT Visit Diagnosis: Muscle weakness (generalized) (M62.81);Difficulty in walking, not elsewhere classified (R26.2)    Time: 9735-3299 PT Time Calculation (min) (ACUTE ONLY): 34 min   Charges:   PT Evaluation $PT Eval Low Complexity: 1 Low PT Treatments $Gait Training: 8-22 mins $Therapeutic Exercise: 8-22 mins        Kreg Shropshire, DPT 05/21/2018, 5:31 PM

## 2018-05-21 NOTE — Anesthesia Preprocedure Evaluation (Signed)
Anesthesia Evaluation  Patient identified by MRN, date of birth, ID band Patient awake    Reviewed: Allergy & Precautions, NPO status , Patient's Chart, lab work & pertinent test results  History of Anesthesia Complications Negative for: history of anesthetic complications  Airway Mallampati: II       Dental   Pulmonary sleep apnea , COPD, former smoker,           Cardiovascular hypertension, Pt. on medications (-) Past MI and (-) CHF (-) dysrhythmias (-) Valvular Problems/Murmurs     Neuro/Psych neg Seizures    GI/Hepatic Neg liver ROS, neg GERD  ,  Endo/Other  neg diabetes  Renal/GU negative Renal ROS     Musculoskeletal   Abdominal   Peds  Hematology   Anesthesia Other Findings   Reproductive/Obstetrics                             Anesthesia Physical Anesthesia Plan  ASA: II  Anesthesia Plan: Spinal   Post-op Pain Management:    Induction:   PONV Risk Score and Plan:   Airway Management Planned:   Additional Equipment:   Intra-op Plan:   Post-operative Plan:   Informed Consent: I have reviewed the patients History and Physical, chart, labs and discussed the procedure including the risks, benefits and alternatives for the proposed anesthesia with the patient or authorized representative who has indicated his/her understanding and acceptance.       Plan Discussed with:   Anesthesia Plan Comments:         Anesthesia Quick Evaluation

## 2018-05-21 NOTE — Care Management Note (Signed)
Case Management Note  Patient Details  Name: Joseph Peterson MRN: [id number] Date of Birth: [birth year]  Subjective/Objective:                  Met with the patient and his family to discuss DC plan [facility] list provided to the patient for [facility]  PT per CMS.gov Patient states that he will need a rw AND bsc at home notified Corene Cornea at [facility] of the need Patient wants to use [facility] PT if needed, notified Corene Cornea at [facility] of the need Patient uises [place] PCP is Dr. Daleen Bo   Patient has transportation with family   Daughter is an employee at [employer] and will get the price of the Lovenox for the patient prior to DC  Action/Plan:  [facility] list provided per CMS.gov  [facility] chosen Notified [place] and 3 in 1 needed notified Corene Cornea with [facility] of the need   Expected Discharge Date:  05/23/18               Expected Discharge Plan:     In-House Referral:     Discharge planning Services  CM Consult  Post Acute Care Choice:  Home Health Choice offered to:  Patient, Spouse  DME Arranged:  3-N-1, Walker rolling DME Agency:  [place]:  PT [place]:  [place]  Status of Service:  In process, will continue to follow  If discussed at Long Length of Stay Meetings, dates discussed:    Additional Comments:  Su Hilt, RN 05/21/2018, 3:13 PM

## 2018-05-21 NOTE — H&P (Signed)
Reviewed paper H+P, will be scanned into chart. No changes noted.  

## 2018-05-21 NOTE — Op Note (Signed)
05/21/2018  11:49 AM  PATIENT:  Joseph Peterson  68 y.o. male  PRE-OPERATIVE DIAGNOSIS:  PRIMARY OSTEOARTHRITIS OF RIGHT KNEE  POST-OPERATIVE DIAGNOSIS:  PRIMARY OSTEOARTHRITIS OF RIGHT KNEE  PROCEDURE:  Procedure(s): TOTAL KNEE ARTHROPLASTY-RIGHT (Right)  SURGEON: Laurene Footman, MD  ASSISTANTS: Rachelle Hora, PA-C  ANESTHESIA:   spinal  EBL:  Total I/O In: 800 [I.V.:800] Out: 350 [Urine:275; Blood:75]  BLOOD ADMINISTERED:none  DRAINS: none   LOCAL MEDICATIONS USED:  MARCAINE    and OTHER Exparel and morphine  SPECIMEN:  No Specimen  DISPOSITION OF SPECIMEN:  N/A  COUNTS:  YES  TOURNIQUET:   75 minutes at 300 mmHg  IMPLANTS: Medacta GM K sphere right 5 femur, 5 tibia, 10 mm insert, short stem, 3 patella, all components cemented  DICTATION: .Dragon Dictation  patient was brought to the operating room and after adequate spinal anesthesia was obtained the right leg was prepped and draped in the usual sterile fashion. After patient identification and timeout procedures were completed, tourniquet was raised. A midline skin incision was made followed by medial parapatellar arthrotomy. There is exposed bone throughout the entire medial femoral condyle with severe patellofemoral and moderate lateral compartment degenerative changes but no exposed bone. The ACL and fat pad were excised off the anterior into the meniscus in the proximal tibia cutting guide from  the [facility] system was applied proximal tibia cut carried out. The distal femoral cut was carried out in a similar fashion and the  5 cutting guide applied with anterior posterior and chamfer cuts made. The posterior horns of the menisci were removed at this point. The  5 tibia baseplate trial was placed pinned into position and proximal tibial preparation carried out with drilling hand reaming and the keel punch followed by placement of the 5 femur and sizing the tibial insert size 1 10 gave the best fit with stability  and full extension. The distal femoral drill holes were made in the notch cut for the trochlear groove was then carried out with trials were then removed the patella was cut using the patellar cutting guide and it sized to a size  3: After drill holes have been made. At this point the above local was infiltrated in the periarticular tissues and periosteum. The knee was irrigated with pulsatile lavage and the bony surfaces dried the tibial component was cemented into place first. Excess cement was removed and the polyethylene insert placed with a torque screw placed with a torque screwdriver tightened. The distal femoral component was placed and the knee was held in extension as the patellar button was clamped into place. After the cement was set excess cement was removed and the knee was again irrigated thoroughly thoroughly irrigated. The tourniquet was let down and hemostasis checked electrocautery. The arthrotomy was repaired with a heavy Quill suture followed by 30V lock subcuticular closure skin staples Praveena incisional wound VAC and Polar Care   PLAN OF CARE: Admit to inpatient   PATIENT DISPOSITION:  PACU - hemodynamically stable.

## 2018-05-21 NOTE — Anesthesia Post-op Follow-up Note (Signed)
Anesthesia QCDR form completed.        

## 2018-05-21 NOTE — Transfer of Care (Signed)
Immediate Anesthesia Transfer of Care Note  Patient: Joseph Peterson  Procedure(s) Performed: TOTAL KNEE ARTHROPLASTY-RIGHT (Right Knee)  Patient Location: PACU  Anesthesia Type:Spinal  Level of Consciousness: drowsy and cooperative  Airway & Oxygen Therapy: Patient Spontanous Breathing and Patient connected to nasal cannula oxygen  Post-op Assessment: Report given to RN and Post -op Vital signs reviewed and stable  Post vital signs: Reviewed and stable  Last Vitals:  Vitals Value Taken Time  BP 95/71 05/21/2018 11:44 AM  Temp    Pulse 71 05/21/2018 11:43 AM  Resp 20 05/21/2018 11:44 AM  SpO2 95 % 05/21/2018 11:44 AM  Vitals shown include unvalidated device data.  Last Pain:  Vitals:   05/21/18 0827  TempSrc: Oral  PainSc: 0-No pain         Complications: No apparent anesthesia complications

## 2018-05-22 LAB — BASIC METABOLIC PANEL
ANION GAP: 6 (ref 5–15)
BUN: 12 mg/dL (ref 8–23)
CO2: 26 mmol/L (ref 22–32)
Calcium: 8.2 mg/dL — ABNORMAL LOW (ref 8.9–10.3)
Chloride: 107 mmol/L (ref 98–111)
Creatinine, Ser: 0.86 mg/dL (ref 0.61–1.24)
GFR calc Af Amer: >60 mL/min (ref >60)
GFR calc non Af Amer: >60 mL/min (ref >60)
Glucose, Bld: 128 mg/dL — ABNORMAL HIGH (ref 70–99)
Potassium: 3.5 mmol/L (ref 3.5–5.1)
Sodium: 139 mmol/L (ref 135–145)

## 2018-05-22 LAB — CBC
HCT: 47.8 % (ref 39.0–52.0)
HEMOGLOBIN: 15.3 g/dL (ref 13.0–17.0)
MCH: 29.2 pg (ref 26.0–34.0)
MCHC: 32 g/dL (ref 30.0–36.0)
MCV: 91.2 fL (ref 80.0–100.0)
NRBC: 0 % (ref 0.0–0.2)
Platelets: 238 10*3/uL (ref 150–400)
RBC: 5.24 MIL/uL (ref 4.22–5.81)
RDW: 13.4 % (ref 11.5–15.5)
WBC: 9.5 10*3/uL (ref 4.0–10.5)

## 2018-05-22 NOTE — Progress Notes (Signed)
   Subjective: 1 Day Post-Op Procedure(s) (LRB): TOTAL KNEE ARTHROPLASTY-RIGHT (Right) Patient reports pain as mild.   Patient is well, and has had no acute complaints or problems Denies any CP, SOB, ABD pain. We will continue therapy today.  Plan is to go Home after hospital stay.  Objective: Vital signs in last 24 hours: Temp:  [97 F (36.1 C)-97.7 F (36.5 C)] 97.7 F (36.5 C) (02/26 0409) Pulse Rate:  [62-87] 70 (02/26 0409) Resp:  [15-20] 19 (02/26 0409) BP: (95-155)/(70-108) 155/93 (02/26 0409) SpO2:  [93 %-98 %] 95 % (02/26 0409) Weight:  [119.6 kg] 119.6 kg (02/25 0827)  Intake/Output from previous day: 02/25 0701 - 02/26 0700 In: 2179.2 [P.O.:480; I.V.:1699.2] Out: 1275 [Urine:1200; Blood:75] Intake/Output this shift: No intake/output data recorded.  Recent Labs    05/21/18 0827 05/21/18 1326 05/22/18 0413  HGB 17.3* 14.9 15.3   Recent Labs    05/21/18 1326 05/22/18 0413  WBC 9.0 9.5  RBC 5.11 5.24  HCT 45.9 47.8  PLT 266 238   Recent Labs    05/21/18 0827 05/21/18 1326 05/22/18 0413  NA 143  --  139  K 4.4  --  3.5  CL  --   --  107  CO2  --   --  26  BUN  --   --  12  CREATININE  --  0.94 0.86  GLUCOSE 106*  --  128*  CALCIUM  --   --  8.2*   No results for input(s): LABPT, INR in the last 72 hours.  EXAM General - Patient is Alert, Appropriate and Oriented Extremity - Neurovascular intact Sensation intact distally Intact pulses distally Dorsiflexion/Plantar flexion intact No cellulitis present Compartment soft Dressing - dressing C/D/I and no drainage, prevena intact with out drainage Motor Function - intact, moving foot and toes well on exam.   Past Medical History:  Diagnosis Date  . COPD (chronic obstructive pulmonary disease) ([place])   . Hypertension     Assessment/Plan:   1 Day Post-Op Procedure(s) (LRB): TOTAL KNEE ARTHROPLASTY-RIGHT (Right) Active Problems:   S/P TKR (total knee replacement) using cement,  right  Estimated body mass index is 38.94 kg/m as calculated from the following:   Height as of this encounter: 5\' 9"  (1.753 m).   Weight as of this encounter: 119.6 kg. Advance diet Up with therapy  Needs BM Labs and VSS Recheck labs in the am CM to assist with discharge   DVT Prophylaxis - Lovenox, TED hose and SCDs Weight-Bearing as tolerated to right leg   T. Rachelle Hora, PA-C [place] 05/22/2018, 8:05 AM

## 2018-05-22 NOTE — Anesthesia Postprocedure Evaluation (Signed)
Anesthesia Post Note  Patient: [name]  Procedure(s) Performed: TOTAL KNEE ARTHROPLASTY-RIGHT (Right Knee)  Patient location during evaluation: Nursing Unit Anesthesia Type: Spinal Level of consciousness: oriented and awake and alert Pain management: pain level controlled Vital Signs Assessment: post-procedure vital signs reviewed and stable Respiratory status: spontaneous breathing and respiratory function stable Cardiovascular status: blood pressure returned to baseline and stable Postop Assessment: no headache, no backache, no apparent nausea or vomiting and patient able to bend at knees Anesthetic complications: no     Last Vitals:  Vitals:   05/22/18 0409 05/22/18 0817  BP: (!) 155/93 (!) 154/94  Pulse: 70 90  Resp: 19 20  Temp: 36.5 C 36.7 C  SpO2: 95% 94%    Last Pain:  Vitals:   05/22/18 0500  TempSrc:   PainSc: 5                  Brantley Fling

## 2018-05-22 NOTE — Evaluation (Signed)
Occupational Therapy Evaluation Patient Details Name: Joseph Peterson MRN: [id number] DOB: 1951/03/09 Today's Date: 05/22/2018    History of Present Illness 68 y/o male s/p R TKA, WBAT 05/21/18.   Clinical Impression   Mr. Oelkers was seen for OT evaluation this date, POD#1 from above surgery. Pt was independent in all ADLs prior to surgery. He was active in the community and able to exercise regularly despite some pain and limited AROM in the R knee. Pt is eager to return to PLOF with less pain and improved safety and independence. Pt currently requires minimal assist for LB dressing while in seated position due to pain and limited AROM of R knee. Pt instructed in polar care mgt, falls prevention strategies, home/routines modifications, DME/AE for LB bathing and dressing tasks, and compression stocking mgt. Pt would benefit from skilled OT services including additional instruction in dressing techniques with or without assistive devices for dressing and bathing skills to support recall and carryover prior to discharge and ultimately to maximize safety, independence, and minimize falls risk and caregiver burden. Recommend HHOT upon hospital DC.      Follow Up Recommendations  Home health OT    Equipment Recommendations  3 in 1 bedside commode;Toilet rise with handles    Recommendations for Other Services       Precautions / Restrictions Precautions Precautions: Fall Restrictions Weight Bearing Restrictions: Yes RLE Weight Bearing: Weight bearing as tolerated      Mobility Bed Mobility Overal bed mobility: Modified Independent             General bed mobility comments: Pt in recliner at start and end of session. Per PT "pt req increased time/effort" bed mobility.   Transfers Overall transfer level: Needs assistance Equipment used: Rolling walker (2 wheeled) Transfers: Sit to/from Stand Sit to Stand: Min guard         General transfer comment: Required consistent  VCs for hand placement. Able to complete Sit<>stand with min guard. Uses UEs to initiate lift.     Balance Overall balance assessment: Needs assistance Sitting-balance support: No upper extremity supported;Feet supported Sitting balance-Leahy Scale: Good     Standing balance support: Bilateral upper extremity supported Standing balance-Leahy Scale: Fair                             ADL either performed or assessed with clinical judgement   ADL Overall ADL's : Needs assistance/impaired Eating/Feeding: Set up;Independent   Grooming: Set up;Independent   Upper Body Bathing: Set up;Independent;Sitting;With adaptive equipment   Lower Body Bathing: Set up;Minimal assistance;Sit to/from stand   Upper Body Dressing : Set up;Independent   Lower Body Dressing: Set up;Minimal assistance;Sit to/from stand;With adaptive equipment   Toilet Transfer: Min guard;BSC;Ambulation;RW   Toileting- Clothing Manipulation and Hygiene: Minimal assistance;Sit to/from stand;With adaptive equipment   Tub/ Shower Transfer: Min guard;Tub bench;Ambulation   Functional mobility during ADLs: Min guard;Rolling walker       Vision Baseline Vision/History: Wears glasses Wears Glasses: At all times Patient Visual Report: No change from baseline       Perception     Praxis      Pertinent Vitals/Pain Pain Assessment: 0-10 Pain Score: 3  Pain Location: R knee Pain Descriptors / Indicators: Aching;Grimacing;Guarding Pain Intervention(s): Limited activity within patient's tolerance;Monitored during session;Premedicated before session;Repositioned     Hand Dominance     Extremity/Trunk Assessment Upper Extremity Assessment Upper Extremity Assessment: Overall WFL for tasks assessed(UE ROM WFL,  pt denies history of shoulder pain, etc. Able to complete ADLs, bed mobility, etc. without difficulty)   Lower Extremity Assessment Lower Extremity Assessment: RLE deficits/detail;Defer to PT  evaluation;Overall [facility] for tasks assessed RLE Deficits / Details: s/p R TKR.       Communication Communication Communication: No difficulties   Cognition Arousal/Alertness: Awake/alert Behavior During Therapy: WFL for tasks assessed/performed Overall Cognitive Status: Within Functional Limits for tasks assessed                                     General Comments       Exercises  Other Exercises: Pt and caregiver (wife) educated on polar care mgmt, falls prevention strategies, compression stocking mgmt, and use of AE for LB ADL in order to [facility] function and improve safety upon hospital DC.    Shoulder Instructions      Home Living Family/patient expects to be discharged to:: Private residence Living Arrangements: Spouse/significant other Available Help at Discharge: Family;Available 24 hours/day   Home Access: Stairs to enter [employer] of Steps: 3 Entrance Stairs-Rails: Can reach both Home Layout: One level               Home Equipment: Hand held shower head;Cane - quad          Prior Functioning/Environment Level of Independence: Independent        Comments: Pt independent with ADL, IADL, functional mobility. A community ambulator who exercises regularly.         OT Problem List: Decreased strength;Decreased range of motion;Decreased activity tolerance;Impaired balance (sitting and/or standing);Decreased coordination;Decreased safety awareness;Decreased knowledge of use of DME or AE;Decreased knowledge of precautions;Pain      OT Treatment/Interventions: Self-care/ADL training;Therapeutic exercise;Therapeutic activities;DME and/or AE instruction;Patient/family education    OT Goals(Current goals can be found in the care plan section) Acute Rehab OT Goals Patient Stated Goal: get back to normal OT Goal Formulation: With patient Time For Goal Achievement: 06/05/18 Potential to Achieve Goals: Good ADL Goals Pt Will Perform  Lower Body Bathing: with modified independence;sit to/from stand(With LRAD/DME for safety and improved functional independence.) Pt Will Perform Lower Body Dressing: with modified independence;with adaptive equipment;sit to/from stand(With LRAD/DME for safety and improved functional independence.) Pt Will Transfer to Toilet: with modified independence;ambulating;bedside commode(With LRAD/DME for safety and improved functional independence.) Pt Will Perform Toileting - Clothing Manipulation and hygiene: with modified independence;with adaptive equipment;sit to/from stand(With LRAD for safety and improved functional independence.)  OT Frequency: Min 1X/week   Barriers to D/C:            Co-evaluation              AM-PAC OT "6 Clicks" Daily Activity     Outcome Measure Help from another person eating meals?: None Help from another person taking care of personal grooming?: None Help from another person toileting, which includes using toliet, bedpan, or urinal?: A Little Help from another person bathing (including washing, rinsing, drying)?: A Little Help from another person to put on and taking off regular upper body clothing?: A Little Help from another person to put on and taking off regular lower body clothing?: A Little 6 Click Score: 20   End of Session Equipment Utilized During Treatment: Gait belt;Rolling walker  Activity Tolerance: Patient tolerated treatment well Patient left: in chair;with call bell/phone within reach;with chair alarm set;with family/visitor present;with SCD's reapplied  OT Visit Diagnosis: Other abnormalities of  gait and mobility (R26.89);History of falling (Z91.81);Pain Pain - Right/Left: Right Pain - part of body: Knee                Time: 8346-2194 OT Time Calculation (min): 33 min Charges:  OT General Charges $OT Visit: 1 Visit OT Evaluation $OT Eval Low Complexity: 1 Low OT Treatments $Self Care/Home Management : 23-37 mins  Shara Blazing, M.S., OTR/L Ascom: [phone] 05/22/18, 11:45 AM

## 2018-05-22 NOTE — Progress Notes (Signed)
PT Cancellation Note  Patient Details Name: Joseph Peterson MRN: [id number] DOB: [birth year]   Cancelled Treatment:    Reason Eval/Treat Not Completed: Pain limiting ability to participate(Treatment session attempted.  Patient due pain medicine; requested hold until meds received.  RN informed/aware.  Will re-attempt at later time once pain meds administered.)   [name] H. Owens Shark, PT, DPT, NCS 05/22/18, 2:50 PM [phone]

## 2018-05-22 NOTE — Progress Notes (Signed)
Physical Therapy Treatment Patient Details Name: Joseph Peterson MRN: [id number] DOB: [birth year] Today's Date: 05/22/2018    History of Present Illness 68 y/o male s/p R TKA, WBAT 05/21/18.    PT Comments    Able to complete full lap around nursing station (200') with RW, cga.  Min cuing for walker position and R TKE in closed-chain positions, but noted improvements in cadence, R LE loading/stance time and overall comfort/confidence in mobility tasks. Will plan to initiate stair training and issue HEP next session.    Follow Up Recommendations  Home health PT     Equipment Recommendations  Rolling walker with 5" wheels;3in1 (PT)    Recommendations for Other Services       Precautions / Restrictions Precautions Precautions: Fall Restrictions Weight Bearing Restrictions: Yes RLE Weight Bearing: Weight bearing as tolerated    Mobility  Bed Mobility               General bed mobility comments: in recliner beginning/end of treatment session  Transfers Overall transfer level: Needs assistance Equipment used: Rolling walker (2 wheeled) Transfers: Sit to/from Stand Sit to Stand: Min guard         General transfer comment: improved mechanics and overall ease of transfer; encouraged for symmetrical LE foot placement and WBing (as tolerated) with movement transitions  Ambulation/Gait Ambulation/Gait assistance: Min guard Gait Distance (Feet): 200 Feet Assistive device: Rolling walker (2 wheeled)       General Gait Details: reciprocal stepping pattern; progressive improvement in R LE stance time, bilat LE step height/length as distance increased. Min cuing for R TKE in loading/terminal stance; min cuing for postural extension and walker position.  Improving confidence in mobility   Stairs             Wheelchair Mobility    Modified Rankin (Stroke Patients Only)       Balance Overall balance assessment: Needs assistance Sitting-balance support: No  upper extremity supported;Feet supported Sitting balance-Leahy Scale: Good     Standing balance support: Bilateral upper extremity supported Standing balance-Leahy Scale: Fair                              Cognition Arousal/Alertness: Awake/alert Behavior During Therapy: WFL for tasks assessed/performed Overall Cognitive Status: Within Functional Limits for tasks assessed                                        Exercises Other Exercises Other Exercises: Sit/stand x6 with RW, cga/close sup-emphasis on symmetrical foot placement and LE WBing Other Exercises: Standing, closed-chain R TKE with RW, 1x10; min cuing for postural control and muscle activation. Other Exercises: Seated R LE flexion therex, 1x10 Other Exercises: Standing balance to empty bladder and complete hand hygiene at sink, cga/close sup; min cuing for walker management with turns and in narrowed spaces    General Comments        Pertinent Vitals/Pain Pain Assessment: 0-10 Pain Score: 4  Pain Location: R knee Pain Descriptors / Indicators: Aching;Grimacing;Guarding Pain Intervention(s): Limited activity within patient's tolerance;Premedicated before session;Repositioned    Home Living                      Prior Function            PT Goals (current goals can now be found in the care  plan section) Acute Rehab PT Goals Patient Stated Goal: get back to normal PT Goal Formulation: With patient/family Time For Goal Achievement: 06/04/18 Potential to Achieve Goals: Good Progress towards PT goals: Progressing toward goals    Frequency    BID      PT Plan Current plan remains appropriate    Co-evaluation              AM-PAC PT "6 Clicks" Mobility   Outcome Measure  Help needed turning from your back to your side while in a flat bed without using bedrails?: None Help needed moving from lying on your back to sitting on the side of a flat bed without using  bedrails?: None Help needed moving to and from a bed to a chair (including a wheelchair)?: None Help needed standing up from a chair using your arms (e.g., wheelchair or bedside chair)?: A Little Help needed to walk in hospital room?: A Little Help needed climbing 3-5 steps with a railing? : A Little 6 Click Score: 21    End of Session Equipment Utilized During Treatment: Gait belt Activity Tolerance: Patient tolerated treatment well Patient left: in chair;with call bell/phone within reach;with chair alarm set;with family/visitor present Nurse Communication: Mobility status PT Visit Diagnosis: Muscle weakness (generalized) (M62.81);Difficulty in walking, not elsewhere classified (R26.2)     Time: 7353-2992 PT Time Calculation (min) (ACUTE ONLY): 24 min  Charges:  $Gait Training: 8-22 mins $Therapeutic Exercise: 8-22 mins                     Emme Rosenau H. Owens Shark, PT, DPT, NCS 05/22/18, 10:16 PM [phone]

## 2018-05-22 NOTE — Progress Notes (Signed)
Physical Therapy Treatment Patient Details Name: Joseph Peterson MRN: [id number] DOB: Feb 02, 1951 Today's Date: 05/22/2018    History of Present Illness 68 y/o male s/p R TKA, WBAT 05/21/18.    PT Comments    Slight increase in stiffness/soreness to R LE this date, but general pain well-controlled (4/10) per patient report.  Tolerating increase in gait distance (up to 175') with RW, cga/min assist; min cuing for general safety (with sit/stand) and overall gait mechanics to maximize fluidity and R LE knee control/TKE.      Follow Up Recommendations  Home health PT     Equipment Recommendations  Rolling walker with 5" wheels;3in1 (PT)    Recommendations for Other Services       Precautions / Restrictions Precautions Precautions: Fall Restrictions Weight Bearing Restrictions: Yes RLE Weight Bearing: Weight bearing as tolerated    Mobility  Bed Mobility Overal bed mobility: Modified Independent             General bed mobility comments: increased time/effort to complete  Transfers Overall transfer level: Needs assistance Equipment used: Rolling walker (2 wheeled) Transfers: Sit to/from Stand Sit to Stand: Min guard         General transfer comment: tends to pull on RW; cuing for correct hand placement and overall safety.  Heavy use of momentum and UE support to initiate lift off from edge of bed  Ambulation/Gait Ambulation/Gait assistance: Min guard Gait Distance (Feet): 175 Feet Assistive device: Rolling walker (2 wheeled)       General Gait Details: step to progressing to step through gait pattern; min cuing for R TKE in loading and for decreased L LE force of contact (to maximize R LE stance time/loading).  Slow, guarded cadence, but no overt buckling, LOB or safety concerns.   Stairs             Wheelchair Mobility    Modified Rankin (Stroke Patients Only)       Balance Overall balance assessment: Needs assistance Sitting-balance support:  No upper extremity supported;Feet supported Sitting balance-Leahy Scale: Good     Standing balance support: Bilateral upper extremity supported Standing balance-Leahy Scale: Fair                              Cognition Arousal/Alertness: Awake/alert Behavior During Therapy: WFL for tasks assessed/performed Overall Cognitive Status: Within Functional Limits for tasks assessed                                        Exercises Total Joint Exercises Goniometric ROM: R knee: 3-87 degrees Other Exercises Other Exercises: Seated LE therex, 1x12, AROM for muscular strength/endurance: ankle pumps, quad sets, LAQs (with end-range flexion stretching), marching.  Good quad strength/activation Other Exercises: Upper body dressing, set up/sup; lower body dressing, sup to thread (min cuing for technique), cga/min assist for standing balance to pull pants over hips.      General Comments        Pertinent Vitals/Pain Pain Assessment: 0-10 Pain Score: 4  Pain Location: R knee Pain Descriptors / Indicators: Aching;Grimacing;Guarding Pain Intervention(s): Limited activity within patient's tolerance;Monitored during session;Repositioned    Home Living                      Prior Function            PT  Goals (current goals can now be found in the care plan section) Acute Rehab PT Goals Patient Stated Goal: get back to normal PT Goal Formulation: With patient Time For Goal Achievement: 06/04/18 Potential to Achieve Goals: Good Progress towards PT goals: Progressing toward goals    Frequency    BID      PT Plan Current plan remains appropriate    Co-evaluation              AM-PAC PT "6 Clicks" Mobility   Outcome Measure  Help needed turning from your back to your side while in a flat bed without using bedrails?: None Help needed moving from lying on your back to sitting on the side of a flat bed without using bedrails?: None Help needed  moving to and from a bed to a chair (including a wheelchair)?: A Little Help needed standing up from a chair using your arms (e.g., wheelchair or bedside chair)?: A Little Help needed to walk in hospital room?: A Little Help needed climbing 3-5 steps with a railing? : A Little 6 Click Score: 20    End of Session Equipment Utilized During Treatment: Gait belt Activity Tolerance: Patient tolerated treatment well Patient left: in chair;with call bell/phone within reach;with chair alarm set Nurse Communication: Mobility status PT Visit Diagnosis: Muscle weakness (generalized) (M62.81);Difficulty in walking, not elsewhere classified (R26.2)     Time: 3734-2876 PT Time Calculation (min) (ACUTE ONLY): 33 min  Charges:  $Gait Training: 8-22 mins $Therapeutic Exercise: 8-22 mins                     Destynee Stringfellow H. Owens Shark, PT, DPT, NCS 05/22/18, 10:22 AM [phone]

## 2018-05-22 NOTE — Progress Notes (Signed)
Clinical Social Worker (CSW) received SNF consult. PT is recommending home health. RN case manager aware of above. Please reconsult if future social work needs arise. CSW signing off.   Brandy Kabat, LCSW [phone] 

## 2018-05-23 LAB — CBC
HCT: 45 % (ref 39.0–52.0)
Hemoglobin: 14.6 g/dL (ref 13.0–17.0)
MCH: 29.4 pg (ref 26.0–34.0)
MCHC: 32.4 g/dL (ref 30.0–36.0)
MCV: 90.7 fL (ref 80.0–100.0)
Platelets: 238 10*3/uL (ref 150–400)
RBC: 4.96 MIL/uL (ref 4.22–5.81)
RDW: 13.3 % (ref 11.5–15.5)
WBC: 11.2 10*3/uL — ABNORMAL HIGH (ref 4.0–10.5)
nRBC: 0 % (ref 0.0–0.2)

## 2018-05-23 LAB — BASIC METABOLIC PANEL
Anion gap: 8 (ref 5–15)
BUN: 12 mg/dL (ref 8–23)
CO2: 29 mmol/L (ref 22–32)
CREATININE: 0.98 mg/dL (ref 0.61–1.24)
Calcium: 8.5 mg/dL — ABNORMAL LOW (ref 8.9–10.3)
Chloride: 98 mmol/L (ref 98–111)
GFR calc Af Amer: >60 mL/min (ref >60)
GFR calc non Af Amer: >60 mL/min (ref >60)
GLUCOSE: 118 mg/dL — AB (ref 70–99)
Potassium: 3.3 mmol/L — ABNORMAL LOW (ref 3.5–5.1)
Sodium: 135 mmol/L (ref 135–145)

## 2018-05-23 MED ORDER — HYDROCODONE-ACETAMINOPHEN 5-325 MG PO TABS: 1.0000 | ORAL_TABLET | ORAL | 0 refills | Status: DC | PRN

## 2018-05-23 MED ORDER — ENOXAPARIN SODIUM 40 MG/0.4ML ~~LOC~~ SOLN: 40.0000 mg | SUBCUTANEOUS | 0 refills | Status: DC

## 2018-05-23 MED ORDER — POTASSIUM CHLORIDE 20 MEQ PO PACK
20.0000 meq | PACK | Freq: Two times a day (BID) | ORAL | Status: DC
  Administered 2018-05-23: 20 meq via ORAL
  Filled 2018-05-23: qty 1

## 2018-05-23 MED ORDER — METHOCARBAMOL 500 MG PO TABS: 500.0000 mg | ORAL_TABLET | Freq: Four times a day (QID) | ORAL | 0 refills | Status: DC | PRN

## 2018-05-23 NOTE — Progress Notes (Signed)
OT Cancellation Note  Patient Details Name: Joseph Peterson MRN: [id number] DOB: Oct 23, 1950   Cancelled Treatment:    Reason Eval/Treat Not Completed: Patient declined, no reason specified. Pt and spouse in room preparing for discharge, waiting on a walker to be delivered prior to leaving the hospital. Pt/spouse deny additional OT needs at this time. Will re-attempt as appropriate should pt end up not discharging this afternoon.  Jeni Salles, MPH, MS, OTR/L ascom [phone] 05/23/18, 3:00 PM

## 2018-05-23 NOTE — Progress Notes (Signed)
Physical Therapy Treatment Patient Details Name: Joseph Peterson MRN: [id number] DOB: Jun 02, 1950 Today's Date: 05/23/2018    History of Present Illness 68 y/o male s/p R TKA, WBAT 05/21/18.    PT Comments    Pt continues to make expected gains post TKE, he struggled this AM to really engage quad to achieve "lock out" TKE, but was still functional and able to do all exercises and mobility, though he was slower to warm up and start to feel comfortable with quad engagement, WBing during ambulation, etc.  He is very motivated and eager to do all he needs, pt is able to circumambulate the nurses' station without physical assist (consistent cuing for set length, knee mechanics, walker management, etc).  Pt struggled to warm up quad for SLR and though he was able to lift LE off bed, could not keep knee fully extended during the effort.  Pt with moderate pain t/o the session, reports "I'm sweating, that was work" after the session.  Pt's HR was generally >100 during all activity, O2 slowly dropped into the low 90s on room air with activity.  Follow Up Recommendations  Home health PT     Equipment Recommendations  Rolling walker with 5" wheels;3in1 (PT)    Recommendations for Other Services       Precautions / Restrictions Precautions Precautions: Fall Restrictions RLE Weight Bearing: Weight bearing as tolerated    Mobility  Bed Mobility Overal bed mobility: Independent             General bed mobility comments: Easier and with better quality of motion getting to EOB this date than on exam.  Transfers Overall transfer level: Modified independent Equipment used: Rolling walker (2 wheeled) Transfers: Sit to/from Stand Sit to Stand: Supervision         General transfer comment: Pt able to rise without phyiscal assist.  Does continue to need reminders for hand and foot set up and general sequencing.  Ambulation/Gait Ambulation/Gait assistance: Min guard Gait Distance (Feet):  250 Feet Assistive device: Rolling walker (2 wheeled)       General Gait Details: Pt initially with some guarded hesitation on R, but with increased distance achieves consistent and relatively confident cadence.  Pt with subjective reports of feeling much better last half of the effort once R LE warmed up.  Pt with no LOBs, VCs for set length, R knee mechanics and walker use/positioning.   Stairs Stairs: Yes Stairs assistance: Supervision Stair Management: Two rails;One rail Right;Step to pattern Number of Stairs: 4 General stair comments: 2 rounds of 4 steps, side stepping w/ single rail and forward with b/l rail.  Pt did not need assist with either, more confidence with b/l rails but may not be able to reach both as easily at home some alternative trialed with safe execution   Wheelchair Mobility    Modified Rankin (Stroke Patients Only)       Balance Overall balance assessment: Modified Independent                                          Cognition Arousal/Alertness: Awake/alert Behavior During Therapy: WFL for tasks assessed/performed Overall Cognitive Status: Within Functional Limits for tasks assessed  Exercises Total Joint Exercises Ankle Circles/Pumps: AROM;10 reps Quad Sets: Strengthening;10 reps Short Arc Quad: AROM;AAROM;10 reps(AAROM to achieve TKE) Heel Slides: AROM;10 reps Hip ABduction/ADduction: Strengthening;10 reps Straight Leg Raises: AAROM;AROM;15 reps(AAROM X5 to warm it up, struggled to maintain solid TKE ) Knee Flexion: PROM;5 reps Goniometric ROM: 0-92    General Comments        Pertinent Vitals/Pain Pain Assessment: 0-10 Pain Score: 4 (6/10 post session) Pain Location: R knee    Home Living                      Prior Function            PT Goals (current goals can now be found in the care plan section) Progress towards PT goals: Progressing toward  goals    Frequency    BID      PT Plan Current plan remains appropriate    Co-evaluation              AM-PAC PT "6 Clicks" Mobility   Outcome Measure  Help needed turning from your back to your side while in a flat bed without using bedrails?: None Help needed moving from lying on your back to sitting on the side of a flat bed without using bedrails?: None Help needed moving to and from a bed to a chair (including a wheelchair)?: None Help needed standing up from a chair using your arms (e.g., wheelchair or bedside chair)?: None Help needed to walk in hospital room?: None Help needed climbing 3-5 steps with a railing? : None 6 Click Score: 24    End of Session Equipment Utilized During Treatment: Gait belt Activity Tolerance: Patient tolerated treatment well;Patient limited by fatigue Patient left: with call bell/phone within reach;with chair alarm set Nurse Communication: Mobility status PT Visit Diagnosis: Muscle weakness (generalized) (M62.81);Difficulty in walking, not elsewhere classified (R26.2)     Time: 0822-0902 PT Time Calculation (min) (ACUTE ONLY): 40 min  Charges:  $Gait Training: 8-22 mins $Therapeutic Exercise: 23-37 mins                     Kreg Shropshire, DPT 05/23/2018, 9:37 AM

## 2018-05-23 NOTE — Progress Notes (Addendum)
   Subjective: 2 Days Post-Op Procedure(s) (LRB): TOTAL KNEE ARTHROPLASTY-RIGHT (Right) Patient reports pain as mild.   Patient is well, and has had no acute complaints or problems Denies any CP, SOB, ABD pain. We will continue therapy today.  Plan is to go Home after hospital stay.  Objective: Vital signs in last 24 hours: Temp:  [98.1 F (36.7 C)-98.2 F (36.8 C)] 98.2 F (36.8 C) (02/26 2339) Pulse Rate:  [88-90] 88 (02/26 2339) Resp:  [13-20] 13 (02/26 2339) BP: (154-158)/(91-94) 158/91 (02/26 2339) SpO2:  [93 %-94 %] 93 % (02/26 2339)  Intake/Output from previous day: 02/26 0701 - 02/27 0700 In: 120 [P.O.:120] Out: 825 [Urine:825] Intake/Output this shift: No intake/output data recorded.  Recent Labs    05/21/18 0827 05/21/18 1326 05/22/18 0413 05/23/18 0520  HGB 17.3* 14.9 15.3 14.6   Recent Labs    05/22/18 0413 05/23/18 0520  WBC 9.5 11.2*  RBC 5.24 4.96  HCT 47.8 45.0  PLT 238 238   Recent Labs    05/22/18 0413 05/23/18 0520  NA 139 135  K 3.5 3.3*  CL 107 98  CO2 26 29  BUN 12 12  CREATININE 0.86 0.98  GLUCOSE 128* 118*  CALCIUM 8.2* 8.5*   No results for input(s): LABPT, INR in the last 72 hours.  EXAM General - Patient is Alert, Appropriate and Oriented Extremity - Neurovascular intact Sensation intact distally Intact pulses distally Dorsiflexion/Plantar flexion intact No cellulitis present Compartment soft Dressing - dressing C/D/I and no drainage, prevena intact with out drainage Motor Function - intact, moving foot and toes well on exam.   Past Medical History:  Diagnosis Date  . COPD (chronic obstructive pulmonary disease) ([place])   . Hypertension     Assessment/Plan:   2 Days Post-Op Procedure(s) (LRB): TOTAL KNEE ARTHROPLASTY-RIGHT (Right) Active Problems:   S/P TKR (total knee replacement) using cement, right  Estimated body mass index is 38.94 kg/m as calculated from the following:   Height as of this encounter: 5'  9" (1.753 m).   Weight as of this encounter: 119.6 kg. Advance diet Up with therapy  VSS Hypokalemia - oral K given today CM to assist with discharge to home with HHPT today  DVT Prophylaxis - Lovenox, TED hose and SCDs Weight-Bearing as tolerated to right leg   T. Rachelle Hora, PA-C [place] 05/23/2018, 7:16 AM

## 2018-05-23 NOTE — Care Management (Signed)
Called pharmacy at [phone], the daughter works at the pharmacy and is getting the price, no need to get the price for the patient

## 2018-05-23 NOTE — Discharge Summary (Signed)
Physician Discharge Summary  Patient ID: Joseph Peterson MRN: [id number] DOB/AGE: October 03, 1950 68 y.o.  Admit date: 05/21/2018 Discharge date: 05/23/2018  Admission Diagnoses:  PRIMARY OSTEOARTHRITIS OF RIGHT KNEE   Discharge Diagnoses: Patient Active Problem List   Diagnosis Date Noted  . S/P TKR (total knee replacement) using cement, right 05/21/2018    Past Medical History:  Diagnosis Date  . COPD (chronic obstructive pulmonary disease) ([place])   . Hypertension      Transfusion: none   Consultants (if any):   Discharged Condition: Improved  Hospital Course: Joseph Peterson is an 68 y.o. male who was admitted 05/21/2018 with a diagnosis of right knee osteoarthritis and went to the operating room on 05/21/2018 and underwent the above named procedures.    Surgeries: Procedure(s): TOTAL KNEE ARTHROPLASTY-RIGHT on 05/21/2018 Patient tolerated the surgery well. Taken to PACU where she was stabilized and then transferred to the orthopedic floor.  Started on Lovenox 30 mg q 12 hrs. Foot pumps applied bilaterally at 80 mm. Heels elevated on bed with rolled towels. No evidence of DVT. Negative Homan. Physical therapy started on day #1 for gait training and transfer. OT started day #1 for ADL and assisted devices.  Patient's foley was d/c on day #1. Patient's IV  was d/c on day #2.  On post op day #2 patient was stable and ready for discharge to home with HHPT.  Implants: Medacta GM K sphere right 5 femur, 5 tibia, 10 mm insert, short stem, 3 patella, all components cemented  He was given perioperative antibiotics:  Anti-infectives (From admission, onward)   Start     Dose/Rate Route Frequency Ordered Stop   05/21/18 1330  ceFAZolin (ANCEF) IVPB 2g/100 mL premix     2 g 200 mL/hr over 30 Minutes Intravenous Every 6 hours 05/21/18 1315 05/21/18 2134   05/21/18 1006  gentamicin (GARAMYCIN) 80 mg in sodium chloride 0.9 % 500 mL irrigation  Status:  Discontinued       As needed  05/21/18 1006 05/21/18 1138   05/21/18 0736  ceFAZolin (ANCEF) 2-4 GM/100ML-% IVPB    Note to Pharmacy:  Phineas Real   : cabinet override      05/21/18 0736 05/21/18 0956   05/21/18 0100  ceFAZolin (ANCEF) IVPB 2g/100 mL premix     2 g 200 mL/hr over 30 Minutes Intravenous  Once 05/21/18 0050 05/21/18 1026    .  He was given sequential compression devices, early ambulation, and Lovenox TEDs for DVT prophylaxis.  He benefited maximally from the hospital stay and there were no complications.    Recent vital signs:  Vitals:   05/23/18 0908 05/23/18 0913  BP: (!) 137/91 (!) 137/91  Pulse:  (!) 113  Resp:  18  Temp:    SpO2:  91%    Recent laboratory studies:  Lab Results  Component Value Date   HGB 14.6 05/23/2018   HGB 15.3 05/22/2018   HGB 14.9 05/21/2018   Lab Results  Component Value Date   WBC 11.2 (H) 05/23/2018   PLT 238 05/23/2018   Lab Results  Component Value Date   INR 0.95 05/15/2018   Lab Results  Component Value Date   NA 135 05/23/2018   K 3.3 (L) 05/23/2018   CL 98 05/23/2018   CO2 29 05/23/2018   BUN 12 05/23/2018   CREATININE 0.98 05/23/2018   GLUCOSE 118 (H) 05/23/2018    Discharge Medications:   Allergies as of 05/23/2018   No Known Allergies  Medication List    TAKE these medications   amLODipine 10 MG tablet Commonly known as:  NORVASC Take 10 mg by mouth daily.   aspirin EC 81 MG tablet Take 81 mg by mouth daily.   enoxaparin 40 MG/0.4ML injection Commonly known as:  LOVENOX Inject 0.4 mLs (40 mg total) into the skin daily for 14 days.   fenofibrate 160 MG tablet Take 160 mg by mouth daily.   hydrochlorothiazide 25 MG tablet Commonly known as:  HYDRODIURIL Take 25 mg by mouth daily.   HYDROcodone-acetaminophen 5-325 MG tablet Commonly known as:  NORCO/VICODIN Take 1-2 tablets by mouth every 4 (four) hours as needed for moderate pain (pain score 4-6).   methocarbamol 500 MG tablet Commonly known as:   ROBAXIN Take 1 tablet (500 mg total) by mouth every 6 (six) hours as needed for muscle spasms.            Durable Medical Equipment  (From admission, onward)         Start     Ordered   05/21/18 1316  DME Walker rolling  Once    Question:  Patient needs a walker to treat with the following condition  Answer:  S/P TKR (total knee replacement) using cement, right   05/21/18 1315   05/21/18 1316  DME 3 n 1  Once     05/21/18 1315   05/21/18 1316  DME Bedside commode  Once    Question:  Patient needs a bedside commode to treat with the following condition  Answer:  S/P TKR (total knee replacement) using cement, right   05/21/18 1315          Diagnostic Studies: Dg Knee 1-2 Views Right  Result Date: 05/21/2018 CLINICAL DATA:  Right knee replacement. EXAM: RIGHT KNEE - 1-2 VIEW COMPARISON:  CT of the right knee 04/23/2018 FINDINGS: The patient is status post right total knee arthroplasty. Femoral and tibial components are well seated. There is gas and fluid in the joint. A drain is in place. The knee is located. IMPRESSION: Right total knee arthroplasty without radiographic evidence for complication Electronically Signed   By: San Morelle M.D.   On: 05/21/2018 12:40   Ct Knee Right Wo Contrast  Result Date: 04/23/2018 CLINICAL DATA:  Chronic right knee pain. EXAM: CT OF THE RIGHT KNEE WITHOUT CONTRAST TECHNIQUE: Multidetector CT imaging of the right knee was performed according to the standard protocol. Multiplanar CT image reconstructions were also generated. COMPARISON:  None. FINDINGS: Bones/Joint/Cartilage The hip demonstrates no fracture or dislocation. There is no lytic or blastic lesion. Mild joint space narrowing posteriorly The knee demonstrates no fracture or dislocation. There is no lytic or blastic lesion. Severe medial compartment joint space narrowing. Mild patellofemoral compartment joint space narrowing. Tricompartmental marginal osteophytes. No significant joint  effusion. The ankle demonstrates no fracture or dislocation. There is no lytic or blastic lesion. Old medial malleolar trauma. Ligaments Suboptimally assessed by CT. Muscles and Tendons No muscle atrophy.  The extensor mechanism is intact. Soft tissues No soft tissue mass or fluid collection. IMPRESSION: 1. Tricompartmental osteoarthritis, severe in the medial compartment. Electronically Signed   By: Titus Dubin M.D.   On: 04/23/2018 17:32    Disposition:     Follow-up Information    Duanne Guess, PA-C Follow up in 2 week(s).   Specialties:  Orthopedic Surgery, Emergency Medicine Contact information: [place] [address] [phone]  Signed: Dorise Hiss CHRISTOPHER 05/23/2018, 1:40 PM

## 2018-05-23 NOTE — Discharge Instructions (Signed)

## 2018-05-24 DIAGNOSIS — J449 Chronic obstructive pulmonary disease, unspecified: Secondary | ICD-10-CM | POA: Diagnosis not present

## 2018-05-24 DIAGNOSIS — Z96651 Presence of right artificial knee joint: Secondary | ICD-10-CM | POA: Diagnosis not present

## 2018-05-24 DIAGNOSIS — Z471 Aftercare following joint replacement surgery: Secondary | ICD-10-CM | POA: Diagnosis not present

## 2018-05-24 DIAGNOSIS — Z7901 Long term (current) use of anticoagulants: Secondary | ICD-10-CM | POA: Diagnosis not present

## 2018-05-24 DIAGNOSIS — I10 Essential (primary) hypertension: Secondary | ICD-10-CM | POA: Diagnosis not present

## 2018-05-24 DIAGNOSIS — Z9181 History of falling: Secondary | ICD-10-CM | POA: Diagnosis not present

## 2018-05-24 DIAGNOSIS — Z7982 Long term (current) use of aspirin: Secondary | ICD-10-CM | POA: Diagnosis not present

## 2018-05-27 DIAGNOSIS — Z7901 Long term (current) use of anticoagulants: Secondary | ICD-10-CM | POA: Diagnosis not present

## 2018-05-27 DIAGNOSIS — Z471 Aftercare following joint replacement surgery: Secondary | ICD-10-CM | POA: Diagnosis not present

## 2018-05-27 DIAGNOSIS — Z96651 Presence of right artificial knee joint: Secondary | ICD-10-CM | POA: Diagnosis not present

## 2018-05-27 DIAGNOSIS — Z7982 Long term (current) use of aspirin: Secondary | ICD-10-CM | POA: Diagnosis not present

## 2018-05-27 DIAGNOSIS — Z9181 History of falling: Secondary | ICD-10-CM | POA: Diagnosis not present

## 2018-05-27 DIAGNOSIS — I10 Essential (primary) hypertension: Secondary | ICD-10-CM | POA: Diagnosis not present

## 2018-05-27 DIAGNOSIS — J449 Chronic obstructive pulmonary disease, unspecified: Secondary | ICD-10-CM | POA: Diagnosis not present

## 2018-05-29 DIAGNOSIS — J449 Chronic obstructive pulmonary disease, unspecified: Secondary | ICD-10-CM | POA: Diagnosis not present

## 2018-05-29 DIAGNOSIS — Z96651 Presence of right artificial knee joint: Secondary | ICD-10-CM | POA: Diagnosis not present

## 2018-05-29 DIAGNOSIS — Z9181 History of falling: Secondary | ICD-10-CM | POA: Diagnosis not present

## 2018-05-29 DIAGNOSIS — Z471 Aftercare following joint replacement surgery: Secondary | ICD-10-CM | POA: Diagnosis not present

## 2018-05-29 DIAGNOSIS — Z7901 Long term (current) use of anticoagulants: Secondary | ICD-10-CM | POA: Diagnosis not present

## 2018-05-29 DIAGNOSIS — I10 Essential (primary) hypertension: Secondary | ICD-10-CM | POA: Diagnosis not present

## 2018-05-29 DIAGNOSIS — Z7982 Long term (current) use of aspirin: Secondary | ICD-10-CM | POA: Diagnosis not present

## 2018-05-31 DIAGNOSIS — Z471 Aftercare following joint replacement surgery: Secondary | ICD-10-CM | POA: Diagnosis not present

## 2018-05-31 DIAGNOSIS — Z7982 Long term (current) use of aspirin: Secondary | ICD-10-CM | POA: Diagnosis not present

## 2018-05-31 DIAGNOSIS — Z96651 Presence of right artificial knee joint: Secondary | ICD-10-CM | POA: Diagnosis not present

## 2018-05-31 DIAGNOSIS — I10 Essential (primary) hypertension: Secondary | ICD-10-CM | POA: Diagnosis not present

## 2018-05-31 DIAGNOSIS — Z7901 Long term (current) use of anticoagulants: Secondary | ICD-10-CM | POA: Diagnosis not present

## 2018-05-31 DIAGNOSIS — J449 Chronic obstructive pulmonary disease, unspecified: Secondary | ICD-10-CM | POA: Diagnosis not present

## 2018-05-31 DIAGNOSIS — Z9181 History of falling: Secondary | ICD-10-CM | POA: Diagnosis not present

## 2018-06-03 DIAGNOSIS — Z9181 History of falling: Secondary | ICD-10-CM | POA: Diagnosis not present

## 2018-06-03 DIAGNOSIS — Z7982 Long term (current) use of aspirin: Secondary | ICD-10-CM | POA: Diagnosis not present

## 2018-06-03 DIAGNOSIS — I10 Essential (primary) hypertension: Secondary | ICD-10-CM | POA: Diagnosis not present

## 2018-06-03 DIAGNOSIS — J449 Chronic obstructive pulmonary disease, unspecified: Secondary | ICD-10-CM | POA: Diagnosis not present

## 2018-06-03 DIAGNOSIS — Z96651 Presence of right artificial knee joint: Secondary | ICD-10-CM | POA: Diagnosis not present

## 2018-06-03 DIAGNOSIS — Z471 Aftercare following joint replacement surgery: Secondary | ICD-10-CM | POA: Diagnosis not present

## 2018-06-03 DIAGNOSIS — Z7901 Long term (current) use of anticoagulants: Secondary | ICD-10-CM | POA: Diagnosis not present

## 2018-06-05 DIAGNOSIS — J449 Chronic obstructive pulmonary disease, unspecified: Secondary | ICD-10-CM | POA: Diagnosis not present

## 2018-06-05 DIAGNOSIS — Z7901 Long term (current) use of anticoagulants: Secondary | ICD-10-CM | POA: Diagnosis not present

## 2018-06-05 DIAGNOSIS — Z471 Aftercare following joint replacement surgery: Secondary | ICD-10-CM | POA: Diagnosis not present

## 2018-06-05 DIAGNOSIS — Z7982 Long term (current) use of aspirin: Secondary | ICD-10-CM | POA: Diagnosis not present

## 2018-06-05 DIAGNOSIS — Z96651 Presence of right artificial knee joint: Secondary | ICD-10-CM | POA: Diagnosis not present

## 2018-06-05 DIAGNOSIS — Z9181 History of falling: Secondary | ICD-10-CM | POA: Diagnosis not present

## 2018-06-05 DIAGNOSIS — I10 Essential (primary) hypertension: Secondary | ICD-10-CM | POA: Diagnosis not present

## 2018-06-07 DIAGNOSIS — I10 Essential (primary) hypertension: Secondary | ICD-10-CM | POA: Diagnosis not present

## 2018-06-07 DIAGNOSIS — Z96651 Presence of right artificial knee joint: Secondary | ICD-10-CM | POA: Diagnosis not present

## 2018-06-07 DIAGNOSIS — Z9181 History of falling: Secondary | ICD-10-CM | POA: Diagnosis not present

## 2018-06-07 DIAGNOSIS — Z471 Aftercare following joint replacement surgery: Secondary | ICD-10-CM | POA: Diagnosis not present

## 2018-06-07 DIAGNOSIS — Z7901 Long term (current) use of anticoagulants: Secondary | ICD-10-CM | POA: Diagnosis not present

## 2018-06-07 DIAGNOSIS — J449 Chronic obstructive pulmonary disease, unspecified: Secondary | ICD-10-CM | POA: Diagnosis not present

## 2018-06-07 DIAGNOSIS — Z7982 Long term (current) use of aspirin: Secondary | ICD-10-CM | POA: Diagnosis not present

## 2018-06-10 ENCOUNTER — Other Ambulatory Visit: Payer: Self-pay

## 2018-06-10 ENCOUNTER — Encounter (HOSPITAL_COMMUNITY): Payer: Self-pay

## 2018-06-10 ENCOUNTER — Ambulatory Visit (HOSPITAL_COMMUNITY): Payer: Medicare Other | Attending: Orthopedic Surgery

## 2018-06-10 DIAGNOSIS — M25561 Pain in right knee: Secondary | ICD-10-CM | POA: Diagnosis not present

## 2018-06-10 DIAGNOSIS — R600 Localized edema: Secondary | ICD-10-CM | POA: Insufficient documentation

## 2018-06-10 DIAGNOSIS — M25661 Stiffness of right knee, not elsewhere classified: Secondary | ICD-10-CM | POA: Insufficient documentation

## 2018-06-10 DIAGNOSIS — R29898 Other symptoms and signs involving the musculoskeletal system: Secondary | ICD-10-CM | POA: Diagnosis not present

## 2018-06-10 NOTE — Therapy (Signed)
[place], [place], [address] Phone: [phone]   Fax:  [phone]  Physical Therapy Evaluation  Patient Details  Name: Joseph Peterson MRN: [id number] Date of Birth: January 19, 1951 Referring Provider (PT): Dorise Hiss, PA-C; Hessie Knows, MD (surgeon)   Encounter Date: 06/10/2018  PT End of Session - 06/10/18 1022    Visit Number  1    Number of Visits  18    Date for PT Re-Evaluation  07/22/18   mini reassess on 07/01/18   Authorization Type  [employer] Medicare    Authorization Time Period  06/10/18 to 07/22/18    Authorization - Visit Number  1    Authorization - Number of Visits  10    PT Start Time  0941    PT Stop Time  1015    PT Time Calculation (min)  34 min    Activity Tolerance  Patient tolerated treatment well    Behavior During Therapy  [facility] for tasks assessed/performed       Past Medical History:  Diagnosis Date  . COPD (chronic obstructive pulmonary disease) ([place])   . Hypertension     Past Surgical History:  Procedure Laterality Date  . COLONOSCOPY N/A 05/26/2013   Procedure: COLONOSCOPY;  Surgeon: Daneil Dolin, MD;  [facility];  Service: Endoscopy;  Laterality: N/A;  1:00 PM-moved to [place] notified pt  . TOTAL KNEE ARTHROPLASTY Right 05/21/2018   Procedure: TOTAL KNEE ARTHROPLASTY-RIGHT;  Surgeon: Hessie Knows, MD;  Location: [facility] ORS;  Service: Orthopedics;  Laterality: Right;    There were no vitals filed for this visit.   Subjective Assessment - 06/10/18 0944    Subjective  Pt reports undergoing R TKA on 05/21/18 by Dr. Rudene Christians. He had HHPT following acute care d/c and was d/c from [place] on 06/07/18. He reports his pain is well managed, he just feels some tightness which he thinks is swelling. He is currently using a walking stick during gait but prior to surgery he did not require any AD. He reports having the most difficulty with straightening out his knee.     Limitations   Walking;Lifting    How long can you sit comfortably?  1 hour    How long can you stand comfortably?  10-15 mins    How long can you walk comfortably?  hasn't pushed it really but goes around house    Patient Stated Goals  no pain    Currently in Pain?  No/denies         [facility] PT Assessment - 06/10/18 0001      Assessment   Medical Diagnosis  R TKA    Referring Provider (PT)  Dorise Hiss, PA-C; Hessie Knows, MD (surgeon)    Onset Date/Surgical Date  05/21/18    Next MD Visit  07/05/18    Prior Therapy  HHPT d/c on 06/07/18      Balance Screen   Has the patient fallen in the past 6 months  No    Has the patient had a decrease in activity level because of a fear of falling?   No    Is the patient reluctant to leave their home because of a fear of falling?   No      Prior Function   Level of Independence  Independent    Vocation  Retired    Leisure  go to [employer], travel      Observation/Other Assessments  Focus on Therapeutic Outcomes (FOTO)   47% limitation      Observation/Other Assessments-Edema    Edema  Circumferential      Circumferential Edema   Circumferential - Right  45.3cm, joint line    Circumferential - Left   40.5cm, joint line      ROM / Strength   AROM / PROM / Strength  AROM;Strength      AROM   AROM Assessment Site  Knee    Right Knee Extension  7    Right Knee Flexion  94      Strength   Strength Assessment Site  Hip;Knee;Ankle    Right Hip Flexion  5/5    Right Hip Extension  4-/5    Right Hip ABduction  4/5    Left Hip Flexion  5/5    Left Hip Extension  4-/5    Left Hip ABduction  4+/5    Right Knee Flexion  4+/5    Right Knee Extension  4+/5    Left Knee Flexion  5/5    Left Knee Extension  5/5    Right Ankle Dorsiflexion  4+/5    Left Ankle Dorsiflexion  5/5      Palpation   Patella mobility  hypomobile throughout      Ambulation/Gait   Ambulation Distance (Feet)  564 Feet   3MWT   Assistive device  None   carried walking  stick    Gait Pattern  Step-through pattern;Decreased hip/knee flexion - right;Decreased dorsiflexion - right;Antalgic;Trendelenburg;Lateral trunk lean to right      Balance   Balance Assessed  Yes      Static Standing Balance   Static Standing - Balance Support  No upper extremity supported    Static Standing Balance -  Activities   Single Leg Stance - Right Leg;Single Leg Stance - Left Leg    Static Standing - Comment/# of Minutes  R: 8sec or < L: 20sec or <      Standardized Balance Assessment   Standardized Balance Assessment  Five Times Sit to Stand    Five times sit to stand comments   9.5sec, no UE, weight shifted off RLE           Objective measurements completed on examination: See above findings.        PT Education - 06/10/18 1022    Education Details  exam findings, HEP, POC    Person(s) Educated  Patient    Methods  Explanation;Demonstration;Handout    Comprehension  Verbalized understanding       PT Short Term Goals - 06/10/18 1034      PT SHORT TERM GOAL #1   Title  Pt will have reduced edema by 3cm or > in order to maximize AROM and reduce overall pain.     Time  3    Period  Weeks    Status  New    Target Date  07/01/18      PT SHORT TERM GOAL #2   Title  Pt will have improved AROM from 0-105 deg in order to maximize sitting tolerance and gait.     Time  3    Period  Weeks    Status  New      PT SHORT TERM GOAL #3   Title  Pt will be able to perform 5xSTS in <10sec with proper form to demo improved functional strength and balance.     Time  3    Period  Weeks  Status  New        PT Long Term Goals - 06/10/18 1035      PT LONG TERM GOAL #1   Title  Pt will have improved AROM from 0-120deg in order to further maximize gait and stair ambulation.     Time  6    Period  Weeks    Status  New    Target Date  07/22/18      PT LONG TERM GOAL #2   Title  Pt will have improved MMT to 5/5 throughout all deficient mm groups in order to demo  improved overall strength and maximize function.     Time  6    Period  Weeks    Status  New      PT LONG TERM GOAL #3   Title  Pt will be able to ambulate 697ft during 3MWT, without AD, and gait WFL in order to demo improved overall functional strength and maximize community access.    Time  6    Period  Weeks    Status  New      PT LONG TERM GOAL #4   Title  Pt will be able to perform R SLS for 15sec or > to demo improved functional hip strength, core strength, and maximize his stair ambulation and gait on uneven ground.    Time  6    Period  Weeks    Status  New             Plan - 06/10/18 1023    Clinical Impression Statement  Pt is pleasant 68YO M who presents to OPPT s/p R TKA on 05/21/18 by Dr. Rudene Christians. Pt currently presents with post-op deficits in edema, strength, AROM, functional strength, functional mobility, gait, and balance. AROM 7-94deg this date and had approximately 5cm of swelling at joint line. Incision appeared well-healing with no s/s of infection. Pt needs skilled PT intervention to address these impairments in order to maximize return to PLOF.     Personal Factors and Comorbidities  Age;Comorbidity 2    Examination-Activity Limitations  Squat;Stand;Stairs    Examination-Participation Restrictions  Yard Work;Community Activity    Stability/Clinical Decision Making  Stable/Uncomplicated    Clinical Decision Making  Low    Rehab Potential  Good    PT Frequency  3x / week    PT Duration  6 weeks    PT Treatment/Interventions  ADLs/Self Care Home Management;Aquatic Therapy;Cryotherapy;Electrical Stimulation;Moist Heat;Ultrasound;DME Instruction;Gait training;Stair training;Functional mobility training;Therapeutic activities;Therapeutic exercise;Balance training;Neuromuscular re-education;Patient/family education;Manual techniques;Passive range of motion;Scar mobilization;Compression bandaging;Dry needling;Energy conservation;Taping    PT Next Visit Plan  review  goals, begin ROM work and addressing edema    PT Home Exercise Plan  eval: quad set, supine HS stretch, supine heel slides, SAQ, SLR    Consulted and Agree with Plan of Care  Patient       Patient will benefit from skilled therapeutic intervention in order to improve the following deficits and impairments:  Abnormal gait, Decreased activity tolerance, Decreased balance, Decreased range of motion, Decreased scar mobility, Decreased strength, Difficulty walking, Hypomobility, Increased edema, Increased fascial restricitons, Increased muscle spasms, Impaired flexibility, Improper body mechanics, Pain  Visit Diagnosis: Acute pain of right knee - Plan: PT plan of care cert/re-cert  Stiffness of right knee, not elsewhere classified - Plan: PT plan of care cert/re-cert  Localized edema - Plan: PT plan of care cert/re-cert  Other symptoms and signs involving the musculoskeletal system - Plan: PT plan of care  cert/re-cert     Problem List Patient Active Problem List   Diagnosis Date Noted  . S/P TKR (total knee replacement) using cement, right 05/21/2018        Geraldine Solar PT, DPT  [place] [address] [place], [place], [address] Phone: [phone]   Fax:  [phone]  Name: Joseph Peterson MRN: [id number] Date of Birth: 1950/07/24

## 2018-06-12 ENCOUNTER — Encounter (HOSPITAL_COMMUNITY): Payer: Self-pay

## 2018-06-12 ENCOUNTER — Ambulatory Visit (HOSPITAL_COMMUNITY): Payer: Medicare Other

## 2018-06-12 ENCOUNTER — Other Ambulatory Visit: Payer: Self-pay

## 2018-06-12 DIAGNOSIS — M25561 Pain in right knee: Secondary | ICD-10-CM | POA: Diagnosis not present

## 2018-06-12 DIAGNOSIS — M25661 Stiffness of right knee, not elsewhere classified: Secondary | ICD-10-CM

## 2018-06-12 DIAGNOSIS — R29898 Other symptoms and signs involving the musculoskeletal system: Secondary | ICD-10-CM

## 2018-06-12 DIAGNOSIS — R600 Localized edema: Secondary | ICD-10-CM | POA: Diagnosis not present

## 2018-06-12 NOTE — Patient Instructions (Signed)
[employer] top of right thigh. Hold for 5-10 seconds. Relax for ___ seconds.  Repeat 10 times. Do 3 times a day. Repeat with other leg.    Copyright  VHI. All rights reserved.

## 2018-06-12 NOTE — Therapy (Signed)
[place], [place], [address] Phone: [phone]   Fax:  [phone]  Physical Therapy Treatment  Patient Details  Name: Joseph Peterson MRN: [id number] Date of Birth: [birth year] Referring Provider (PT): Dorise Hiss, PA-C; Hessie Knows, MD (surgeon)   Encounter Date: 06/12/2018  PT End of Session - 06/12/18 0950    Visit Number  2    Number of Visits  18    Date for PT Re-Evaluation  07/22/18   Minireassess 07/01/18   Authorization Type  [employer] Medicare    Authorization Time Period  06/10/18 to 07/22/18    Authorization - Visit Number  2    Authorization - Number of Visits  10    PT Start Time  0906    PT Stop Time  0948    PT Time Calculation (min)  42 min       Past Medical History:  Diagnosis Date  . COPD (chronic obstructive pulmonary disease) ([place])   . Hypertension     Past Surgical History:  Procedure Laterality Date  . COLONOSCOPY N/A 05/26/2013   Procedure: COLONOSCOPY;  Surgeon: Daneil Dolin, MD;  [facility];  Service: Endoscopy;  Laterality: N/A;  1:00 PM-moved to [place] notified pt  . TOTAL KNEE ARTHROPLASTY Right 05/21/2018   Procedure: TOTAL KNEE ARTHROPLASTY-RIGHT;  Surgeon: Hessie Knows, MD;  Location: [facility] ORS;  Service: Orthopedics;  Laterality: Right;    There were no vitals filed for this visit.  Subjective Assessment - 06/12/18 0909    Subjective  Pt stated knee is feeling good today, no reports of pain currently.  Reports complaince with HEP daily without questions.    Patient Stated Goals  no pain    Currently in Pain?  No/denies                       [facility] Adult PT Treatment/Exercise - 06/12/18 0001      Exercises   Exercises  Knee/Hip      Knee/Hip Exercises: Stretches   Active Hamstring Stretch  3 reps;30 seconds    Active Hamstring Stretch Limitations  supine with rope    Knee: Self-Stretch to increase Flexion  5 reps;10 seconds    Knee: Self-Stretch Limitations  knee drives on 53IR step    Gastroc Stretch  3 reps;30 seconds    Gastroc Stretch Limitations  slant board      Knee/Hip Exercises: Supine   Quad Sets  10 reps    Quad Sets Limitations  5" holds; cueing to improve quad contraction    Short Arc Quad Sets  10 reps    Heel Slides  10 reps    Straight Leg Raises  10 reps    Knee Extension  AROM    Knee Extension Limitations  7    Knee Flexion  AROM    Knee Flexion Limitations  104   was 94     Manual Therapy   Manual Therapy  Edema management    Edema Management  Retrograde massage with LE elevated for edema control                PT Education - 06/12/18 0917    Education Details  Reviewed goals, reviewed compliance with HEP, ROM based exercises, purpose/benefits with retrograde massage for edema control, RICE technqiues       PT Short Term Goals - 06/10/18 1034      PT SHORT  TERM GOAL #1   Title  Pt will have reduced edema by 3cm or > in order to maximize AROM and reduce overall pain.     Time  3    Period  Weeks    Status  New    Target Date  07/01/18      PT SHORT TERM GOAL #2   Title  Pt will have improved AROM from 0-105 deg in order to maximize sitting tolerance and gait.     Time  3    Period  Weeks    Status  New      PT SHORT TERM GOAL #3   Title  Pt will be able to perform 5xSTS in <10sec with proper form to demo improved functional strength and balance.     Time  3    Period  Weeks    Status  New        PT Long Term Goals - 06/10/18 1035      PT LONG TERM GOAL #1   Title  Pt will have improved AROM from 0-120deg in order to further maximize gait and stair ambulation.     Time  6    Period  Weeks    Status  New    Target Date  07/22/18      PT LONG TERM GOAL #2   Title  Pt will have improved MMT to 5/5 throughout all deficient mm groups in order to demo improved overall strength and maximize function.     Time  6    Period  Weeks    Status  New      PT  LONG TERM GOAL #3   Title  Pt will be able to ambulate 666ft during 3MWT, without AD, and gait WFL in order to demo improved overall functional strength and maximize community access.    Time  6    Period  Weeks    Status  New      PT LONG TERM GOAL #4   Title  Pt will be able to perform R SLS for 15sec or > to demo improved functional hip strength, core strength, and maximize his stair ambulation and gait on uneven ground.    Time  6    Period  Weeks    Status  New            Plan - 06/12/18 1553    Clinical Impression Statement  Reviewed goals and assured compliance wiht HEP.  Pt able to recall HEP exercises without cueing required.  Session focus on knee mobility including ROM based exercises and stretches as well as manual technqiues to reduced edema present proximal knees.  Pt able to complete all exercises wiht no reports of pain thorugh session.  Improved AROM to 7-104 degrees.  Encouraged pt to increase frequency wiht quad sets with verbalized understanding and agreement.      Personal Factors and Comorbidities  Age;Comorbidity 2    Examination-Activity Limitations  Squat;Stand;Stairs    Examination-Participation Restrictions  Yard Work;Community Activity    Stability/Clinical Decision Making  Stable/Uncomplicated    Rehab Potential  Good    PT Frequency  3x / week    PT Duration  6 weeks    PT Treatment/Interventions  ADLs/Self Care Home Management;Aquatic Therapy;Cryotherapy;Electrical Stimulation;Moist Heat;Ultrasound;DME Instruction;Gait training;Stair training;Functional mobility training;Therapeutic activities;Therapeutic exercise;Balance training;Neuromuscular re-education;Patient/family education;Manual techniques;Passive range of motion;Scar mobilization;Compression bandaging;Dry needling;Energy conservation;Taping    PT Next Visit Plan  Next sessoin add rocker board.  Continue  with ROM work and addressing edema    PT Home Exercise Plan  eval: quad set, supine HS  stretch, supine heel slides, SAQ, SLR       Patient will benefit from skilled therapeutic intervention in order to improve the following deficits and impairments:  Abnormal gait, Decreased activity tolerance, Decreased balance, Decreased range of motion, Decreased scar mobility, Decreased strength, Difficulty walking, Hypomobility, Increased edema, Increased fascial restricitons, Increased muscle spasms, Impaired flexibility, Improper body mechanics, Pain  Visit Diagnosis: Acute pain of right knee  Stiffness of right knee, not elsewhere classified  Localized edema  Other symptoms and signs involving the musculoskeletal system     Problem List Patient Active Problem List   Diagnosis Date Noted  . S/P TKR (total knee replacement) using cement, right 05/21/2018   Ihor Austin, LPTA; CBIS [phone]  Aldona Lento 06/12/2018, 3:58 PM  [place] [address] [place], [place], [address] Phone: [phone]   Fax:  [phone]  Name: Joseph Peterson MRN: [id number] Date of Birth: [birth year]

## 2018-06-14 ENCOUNTER — Telehealth (HOSPITAL_COMMUNITY): Payer: Self-pay

## 2018-06-14 ENCOUNTER — Ambulatory Visit (HOSPITAL_COMMUNITY): Payer: Medicare Other

## 2018-06-14 NOTE — Telephone Encounter (Signed)
Called and spoke to pt concerning OPPT dept closing for minimal of 2 weeks to reduce spread of COVID-19,  Discussed compliance wiht HEP.  Pt wishes to receive call to schedule apts.  [address] [place]; CBIS [phone]

## 2018-06-17 ENCOUNTER — Ambulatory Visit (HOSPITAL_COMMUNITY): Payer: Medicare Other

## 2018-06-19 ENCOUNTER — Ambulatory Visit (HOSPITAL_COMMUNITY): Payer: Medicare Other

## 2018-06-21 ENCOUNTER — Ambulatory Visit (HOSPITAL_COMMUNITY): Payer: Medicare Other

## 2018-06-24 ENCOUNTER — Encounter (HOSPITAL_COMMUNITY): Payer: Medicare Other

## 2018-06-26 ENCOUNTER — Encounter (HOSPITAL_COMMUNITY): Payer: Medicare Other

## 2018-06-28 ENCOUNTER — Encounter (HOSPITAL_COMMUNITY): Payer: Medicare Other

## 2018-07-03 DIAGNOSIS — Z96651 Presence of right artificial knee joint: Secondary | ICD-10-CM | POA: Diagnosis not present

## 2018-07-03 DIAGNOSIS — M1711 Unilateral primary osteoarthritis, right knee: Secondary | ICD-10-CM | POA: Diagnosis not present

## 2018-07-04 ENCOUNTER — Telehealth (HOSPITAL_COMMUNITY): Payer: Self-pay | Admitting: Physical Therapy

## 2018-07-04 NOTE — Telephone Encounter (Signed)
Micheline Rough was contacted today regarding temporary reduction of Outpatient Rehabilitation Services at [facility] due to concerns for community transmission of COVID-19.  Patient identity was verified.  Assessed if patient needed to be seen in person by clinician (recent fall or acute injury that requires hands on assessment and advice, change in diet order, post-surgical, special cases, etc.).    Patient was offered an in-person follow-up visit due to his history of TKA, however patient did not wish to schedule one at this time. Proceeded with phone call.  Therapist advised the patient to continue to perform his HEP and assured he had no unanswered questions or concerns at this time.   The patient was offered and declined the continuation of their plan of care by using methods such as an E-Visit, virtual check in, or Telehealth visit. However, patient would like to receive weekly calls at this time.   Outpatient Rehabilitation Services at [facility] will follow up with this client when we are able to safely resume care at the clinic to all populations.  Patient is aware we can be reached by telephone during limited business hours in the meantime.   Clarene Critchley PT, DPT 4:29 PM, 07/04/18 [phone]

## 2018-07-10 ENCOUNTER — Telehealth (HOSPITAL_COMMUNITY): Payer: Self-pay

## 2018-07-10 NOTE — Telephone Encounter (Signed)
I called Mr. Joseph Peterson at his listed number and there was no answer for his weekly phone call. I left a message checking in to see how his knee is feeling and inquire if he has any concerns regarding his current HEP. I informed him our office remains closed however if he has any concerns regarding his knee ROM or acute problems to please call us and we would find a time to see him and assess his needs in person. I provided our front office number for him to return our call if he has any questions or concerns.   Kipp Brood, PT, DPT, [facility] Physical Therapist with [facility]  07/10/2018 3:14 PM

## 2018-07-17 ENCOUNTER — Telehealth (HOSPITAL_COMMUNITY): Payer: Self-pay

## 2018-07-17 NOTE — Telephone Encounter (Signed)
I spoke with Joseph Peterson today and discussed how he has been doing with his HEP since our office has closed. He feels he is doing well and saw his MD in the last week and they feels he is doing well also. He does feel it would be appropriate for him to come in for an in-person visit to assess his ROM again and determine if he needs new exercises. I scheduled him for an appointment this Friday 07/19/18 at 9:00 am.   Patient is aware we can be reached by telephone during limited business hours in the meantime.   Kipp Brood, PT, DPT, [facility] Physical Therapist with [facility]  07/17/2018 12:20 PM

## 2018-07-19 ENCOUNTER — Encounter (HOSPITAL_COMMUNITY): Payer: Self-pay | Admitting: Physical Therapy

## 2018-07-19 ENCOUNTER — Other Ambulatory Visit: Payer: Self-pay

## 2018-07-19 ENCOUNTER — Ambulatory Visit (HOSPITAL_COMMUNITY): Payer: Medicare Other | Attending: Orthopedic Surgery | Admitting: Physical Therapy

## 2018-07-19 DIAGNOSIS — M25561 Pain in right knee: Secondary | ICD-10-CM | POA: Diagnosis not present

## 2018-07-19 DIAGNOSIS — M25661 Stiffness of right knee, not elsewhere classified: Secondary | ICD-10-CM | POA: Diagnosis not present

## 2018-07-19 DIAGNOSIS — R600 Localized edema: Secondary | ICD-10-CM | POA: Diagnosis not present

## 2018-07-19 NOTE — Therapy (Signed)
[place], [place], [address] Phone: [phone]   Fax:  [phone]  Physical Therapy Treatment  Patient Details  Name: Joseph Peterson Joseph Peterson Date of Birth: 1950-10-24 Referring Provider (PT): Joseph Hiss, PA-C; Joseph Knows, MD (surgeon)   Encounter Date: 07/19/2018  PHYSICAL THERAPY DISCHARGE SUMMARY  Visits from Start of Care: 3  Current functional level related to goals / functional outcomes: See below    Remaining deficits: Swelling    Education / Equipment: The importance of icing.  Plan: Patient agrees to discharge.  Patient goals were met. Patient is being discharged due to meeting the stated rehab goals.  ?????      PT End of Session - 07/19/18 1503    Visit Number  3    Number of Visits  3   Date for PT Re-Evaluation  07/22/18   Minireassess 07/01/18   Authorization Type  [employer] Medicare    Authorization Time Period  06/10/18 to 07/22/18    Authorization - Visit Number  3    Authorization - Number of Visits  3    PT Start Time  0905    PT Stop Time  0945    PT Time Calculation (min)  40 min    Activity Tolerance  Patient tolerated treatment well    Behavior During Therapy  [facility] for tasks assessed/performed       Past Medical History:  Diagnosis Date  . COPD (chronic obstructive pulmonary disease) ([place])   . Hypertension     Past Surgical History:  Procedure Laterality Date  . COLONOSCOPY N/A 05/26/2013   Procedure: COLONOSCOPY;  Surgeon: Joseph Dolin, MD;  [facility];  Service: Endoscopy;  Laterality: N/A;  1:00 PM-moved to [place] notified pt  . TOTAL KNEE ARTHROPLASTY Right 05/21/2018   Procedure: TOTAL KNEE ARTHROPLASTY-RIGHT;  Surgeon: Joseph Knows, MD;  Location: [facility] ORS;  Service: Orthopedics;  Laterality: Right;    There were no vitals filed for this visit.  Subjective Assessment - 07/19/18 0905    Subjective  Joseph Peterson has not been seen in  this clinic since 3/18 due to Covid 19.  He states that he is doing well and is doing his exercises everyday.    How long can you sit comfortably?  an hour     How long can you stand comfortably?  about 30 minutes     How long can you walk comfortably?  Able to walk for 30 minutes without assistive device.     Patient Stated Goals  no pain    Currently in Pain?  No/denies         [facility] PT Assessment - 07/19/18 0001      Assessment   Medical Diagnosis  R TKA    Referring Provider (PT)  Joseph Hiss, PA-C; Joseph Knows, MD (surgeon)    Onset Date/Surgical Date  05/21/18    Next MD Visit  07/05/18    Prior Therapy  HHPT d/c on 06/07/18      Balance Screen   Has the patient fallen in the past 6 months  No    Has the patient had a decrease in activity level because of a fear of falling?   No    Is the patient reluctant to leave their home because of a fear of falling?   No      Prior Function   Level of Independence  Independent    Vocation  Retired    Leisure  go to [employer], travel      Observation/Other Assessments   Focus on Therapeutic Outcomes (FOTO)   47% limitation      Observation/Other Assessments-Edema    Edema  Circumferential      Circumferential Edema   Circumferential - Right  45.5 cm, joint line    Circumferential - Left   40.5cm, joint line      AROM   Right Knee Extension  0 was 7   Right Knee Flexion  121 was 104      Strength   Right Hip Flexion  5/5    Right Hip Extension  4/5   was 4-   Right Hip ABduction  5/5   was 4/5   Left Hip Flexion  5/5    Left Hip Extension  4/5   was 4-   Left Hip ABduction  5/5    Right Knee Flexion  5/5   was 4+   Right Knee Extension  5/5   was 4+   Left Knee Flexion  5/5    Left Knee Extension  5/5    Right Ankle Dorsiflexion  5/5   was 4+   Left Ankle Dorsiflexion  5/5      Palpation   Patella mobility  hypomobile throughout      Ambulation/Gait   Ambulation Distance (Feet)  778 Feet   3MWT was 564     Assistive device  None   carried walking stick    Gait Pattern  Step-through pattern;Decreased hip/knee flexion - right;Decreased dorsiflexion - right;Antalgic;Trendelenburg;Lateral trunk lean to right      Balance   Balance Assessed  Yes      Static Standing Balance   Static Standing - Balance Support  No upper extremity supported    Static Standing Balance -  Activities   Single Leg Stance - Right Leg;Single Leg Stance - Left Leg    Static Standing - Comment/# of Minutes  Lt 60 seconds; RT 60      Standardized Balance Assessment   Standardized Balance Assessment  Five Times Sit to Stand    Five times sit to stand comments   8.56 with equal wt distribution; 9.5sec, no UE, weight shifted off RLE             PT Short Term Goals - 07/19/18 0915      PT SHORT TERM GOAL #1   Title  Pt will have reduced edema by 3cm or > in order to maximize AROM and reduce overall pain.     Time  3    Period  Weeks    Status  Achieved    Target Date  07/01/18      PT SHORT TERM GOAL #2   Title  Pt will have improved AROM from 0-105 deg in order to maximize sitting tolerance and gait.     Time  3    Period  Weeks    Status  Achieved      PT SHORT TERM GOAL #3   Title  Pt will be able to perform 5xSTS in <10sec with proper form to demo improved functional strength and balance.     Time  3    Period  Weeks    Status  Achieved        PT Long Term Goals - 07/19/18 0915      PT LONG TERM GOAL #1   Title  Pt will have improved AROM from  0-120deg in order to further maximize gait and stair ambulation.     Time  6    Period  Weeks    Status  Achieved      PT LONG TERM GOAL #2   Title  Pt will have improved MMT to 5/5 throughout all deficient mm groups in order to demo improved overall strength and maximize function.     Time  6    Period  Weeks    Status  Partially Met glut max is the only mm not at 5/5      PT LONG TERM GOAL #3   Title  Pt will be able to ambulate 635f during  3MWT, without AD, and gait WFL in order to demo improved overall functional strength and maximize community access.    Time  6    Period  Weeks    Status  met      PT LONG TERM GOAL #4   Title  Pt will be able to perform R SLS for 15sec or > to demo improved functional hip strength, core strength, and maximize his stair ambulation and gait on uneven ground.    Time  6    Period  Weeks    Status  Met            Plan - 07/19/18 1504    Clinical Impression Statement  PT reassessed today with all goal being met.  Pt is no longer using an assistive device when he ambulates and ROM is functional.  At this time we will discharge pt from skilled physical therapy.,    Personal Factors and Comorbidities  Age;Comorbidity 2    Examination-Activity Limitations  Squat;Stand;Stairs    Examination-Participation Restrictions  Yard Work;Community Activity    Stability/Clinical Decision Making  Stable/Uncomplicated    Rehab Potential  Good    PT Frequency  3x / week    PT Duration  6 weeks    PT Treatment/Interventions  ADLs/Self Care Home Management;Aquatic Therapy;Cryotherapy;Electrical Stimulation;Moist Heat;Ultrasound;DME Instruction;Gait training;Stair training;Functional mobility training;Therapeutic activities;Therapeutic exercise;Balance training;Neuromuscular re-education;Patient/family education;Manual techniques;Passive range of motion;Scar mobilization;Compression bandaging;Dry needling;Energy conservation;Taping    PT Next Visit Plan  Discharge.     PT Home Exercise Plan  eval: quad set, supine HS stretch, supine heel slides, SAQ, SLR       Patient will benefit from skilled therapeutic intervention in order to improve the following deficits and impairments:  Abnormal gait, Decreased activity tolerance, Decreased balance, Decreased range of motion, Decreased scar mobility, Decreased strength, Difficulty walking, Hypomobility, Increased edema, Increased fascial restricitons, Increased muscle  spasms, Impaired flexibility, Improper body mechanics, Pain  Visit Diagnosis: Acute pain of right knee  Stiffness of right knee, not elsewhere classified  Localized edema     Problem List Patient Active Problem List   Diagnosis Date Noted  . S/P TKR (total knee replacement) using cement, right 05/21/2018    CRayetta Humphrey PT CLT [id number]/24/2020, 3:06 PM  [place] [address]Phone: [phone]  Fax:  [phone] Name: Joseph PANDITMRN: [id number] of Birth: [birth year]

## 2018-11-26 DIAGNOSIS — Z79899 Other long term (current) drug therapy: Secondary | ICD-10-CM | POA: Diagnosis not present

## 2018-11-26 DIAGNOSIS — I10 Essential (primary) hypertension: Secondary | ICD-10-CM | POA: Diagnosis not present

## 2018-12-03 DIAGNOSIS — M1990 Unspecified osteoarthritis, unspecified site: Secondary | ICD-10-CM | POA: Diagnosis not present

## 2018-12-03 DIAGNOSIS — Z0001 Encounter for general adult medical examination with abnormal findings: Secondary | ICD-10-CM | POA: Diagnosis not present

## 2018-12-03 DIAGNOSIS — G4733 Obstructive sleep apnea (adult) (pediatric): Secondary | ICD-10-CM | POA: Diagnosis not present

## 2018-12-03 DIAGNOSIS — I10 Essential (primary) hypertension: Secondary | ICD-10-CM | POA: Diagnosis not present

## 2018-12-03 DIAGNOSIS — E785 Hyperlipidemia, unspecified: Secondary | ICD-10-CM | POA: Diagnosis not present

## 2018-12-06 DIAGNOSIS — G4733 Obstructive sleep apnea (adult) (pediatric): Secondary | ICD-10-CM | POA: Diagnosis not present

## 2019-01-03 DIAGNOSIS — Z96651 Presence of right artificial knee joint: Secondary | ICD-10-CM | POA: Diagnosis not present

## 2019-01-03 DIAGNOSIS — M1711 Unilateral primary osteoarthritis, right knee: Secondary | ICD-10-CM | POA: Diagnosis not present

## 2019-01-05 DIAGNOSIS — G4733 Obstructive sleep apnea (adult) (pediatric): Secondary | ICD-10-CM | POA: Diagnosis not present

## 2019-01-09 DIAGNOSIS — Z23 Encounter for immunization: Secondary | ICD-10-CM | POA: Diagnosis not present

## 2019-02-05 DIAGNOSIS — G4733 Obstructive sleep apnea (adult) (pediatric): Secondary | ICD-10-CM | POA: Diagnosis not present

## 2019-03-03 DIAGNOSIS — I10 Essential (primary) hypertension: Secondary | ICD-10-CM | POA: Diagnosis not present

## 2019-03-03 DIAGNOSIS — G4733 Obstructive sleep apnea (adult) (pediatric): Secondary | ICD-10-CM | POA: Diagnosis not present

## 2019-03-07 DIAGNOSIS — G4733 Obstructive sleep apnea (adult) (pediatric): Secondary | ICD-10-CM | POA: Diagnosis not present

## 2019-04-07 DIAGNOSIS — G4733 Obstructive sleep apnea (adult) (pediatric): Secondary | ICD-10-CM | POA: Diagnosis not present

## 2019-05-08 DIAGNOSIS — G4733 Obstructive sleep apnea (adult) (pediatric): Secondary | ICD-10-CM | POA: Diagnosis not present

## 2019-05-18 IMAGING — DX DG KNEE 1-2V*R*
2 series · 3 of 3 positions shown · non-contrast
Comparison: CT of the right knee 04/23/2018

CLINICAL DATA: Right knee replacement.

EXAM:
RIGHT KNEE - 1-2 VIEW

[knee ap]
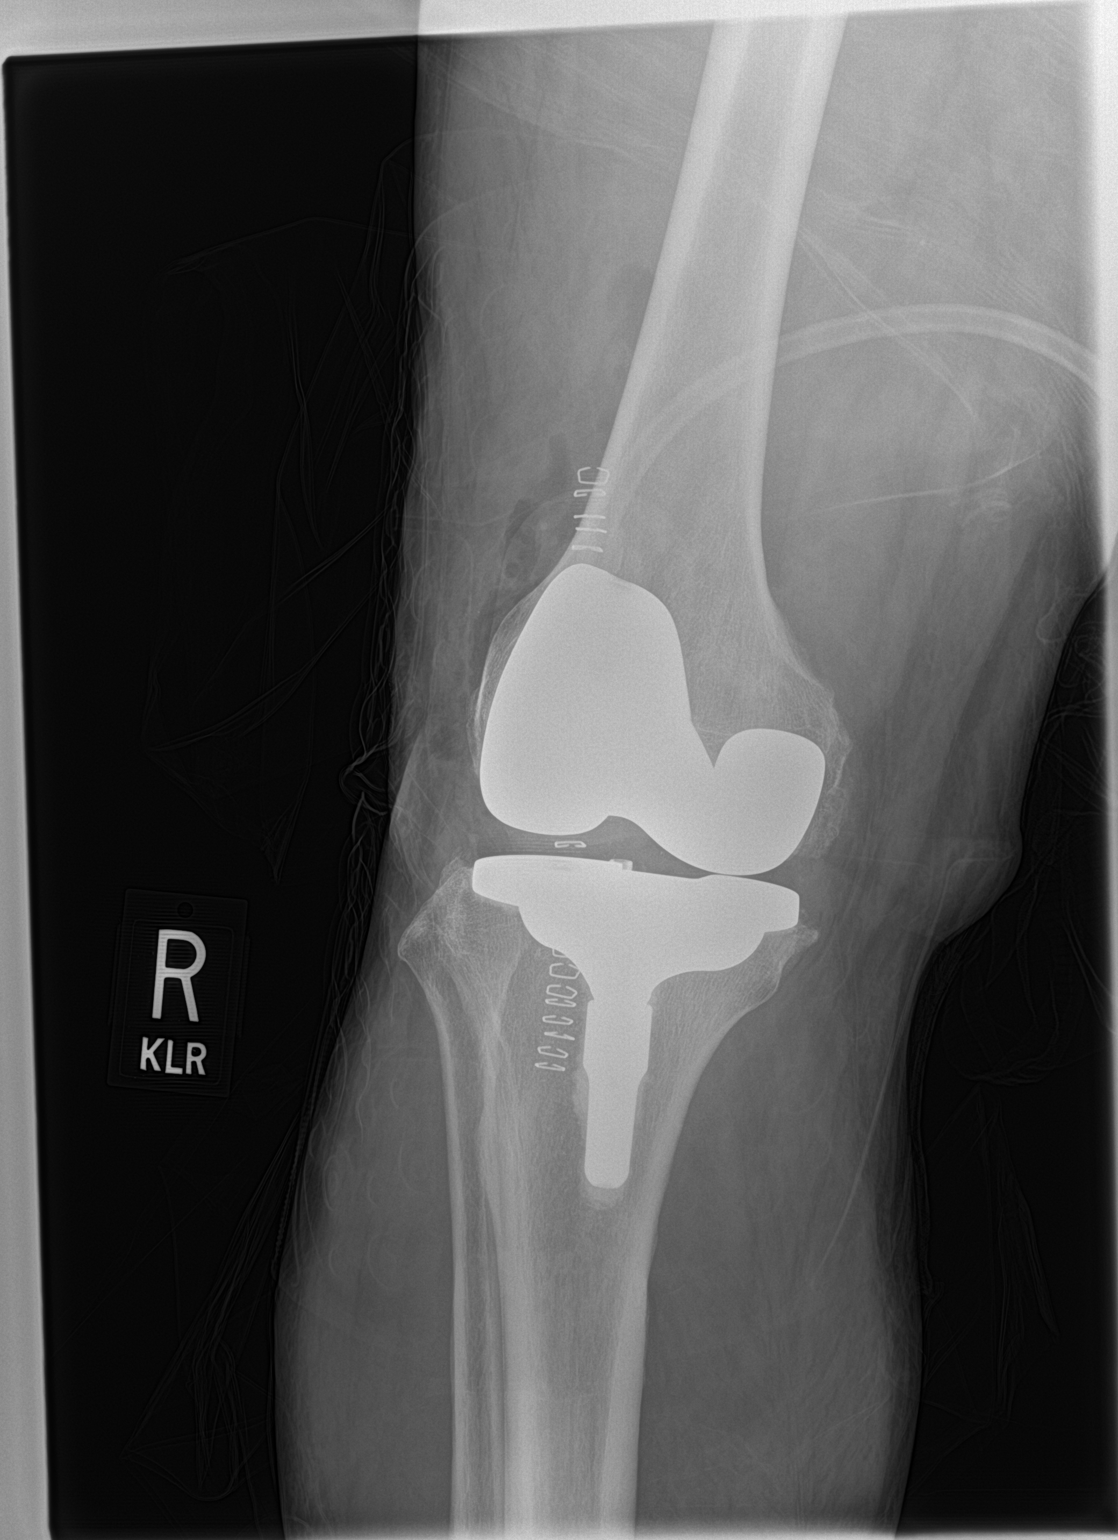

[Series 2: knee lat · 0.14mm/px · 2 of 2 slices shown]
[im 1/2]
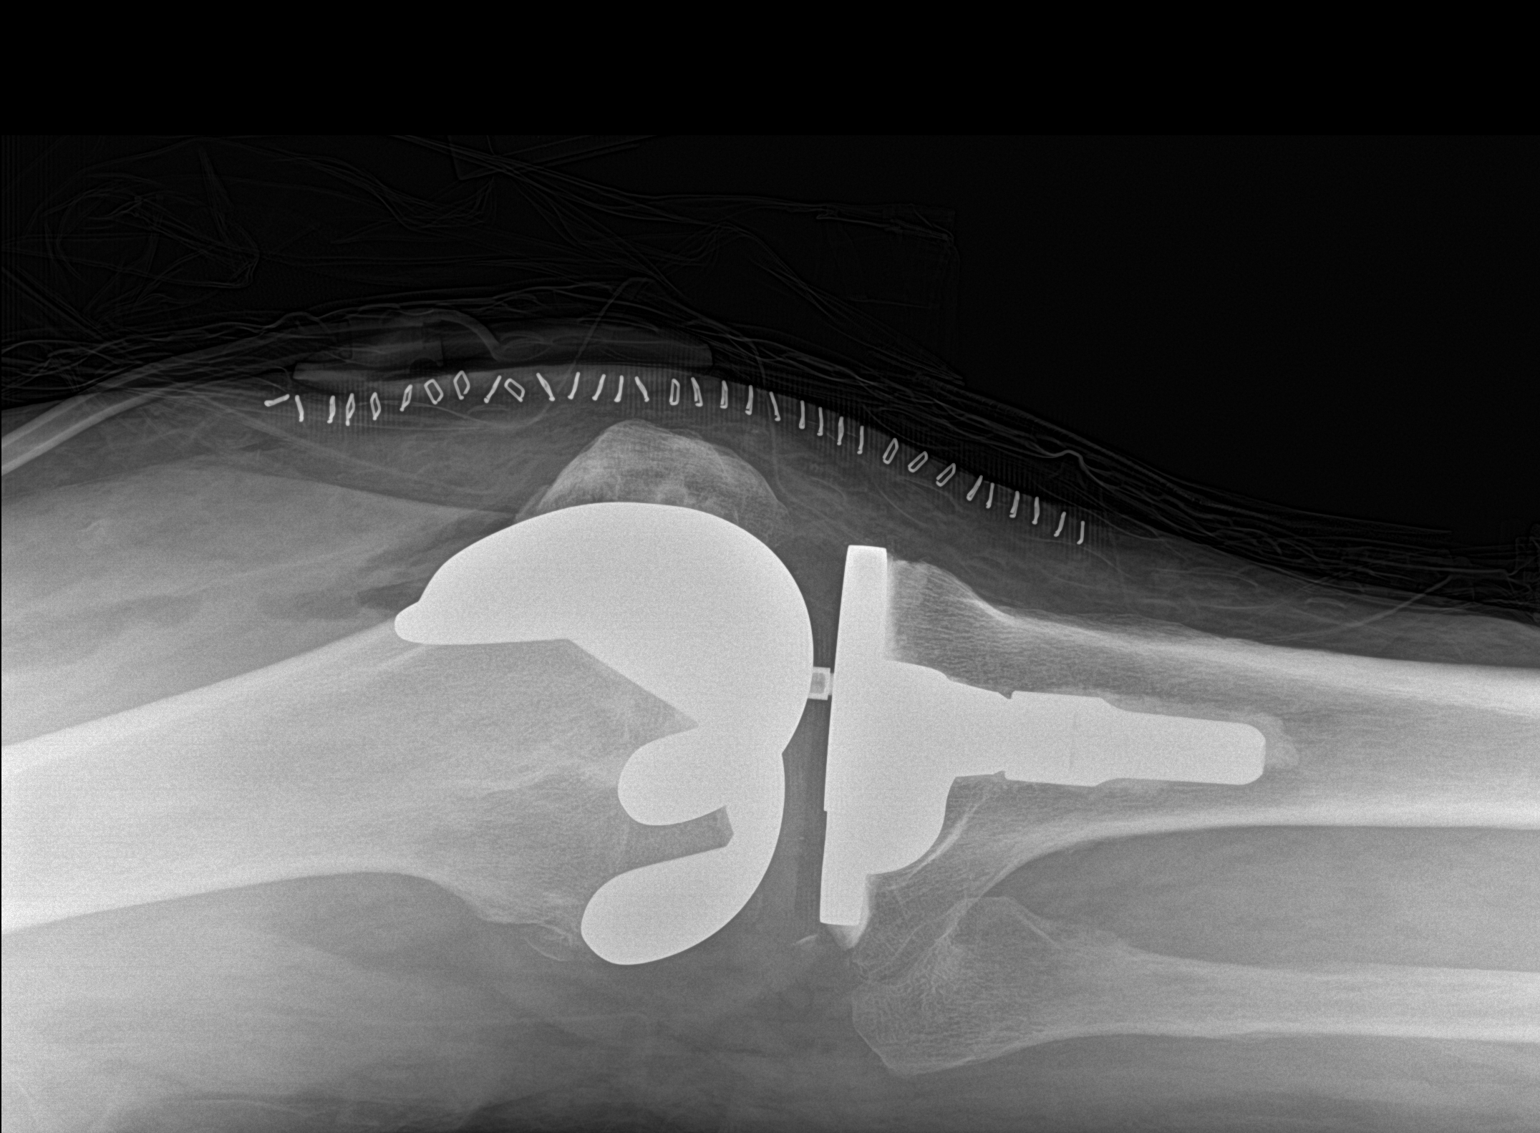
[im 2/2]
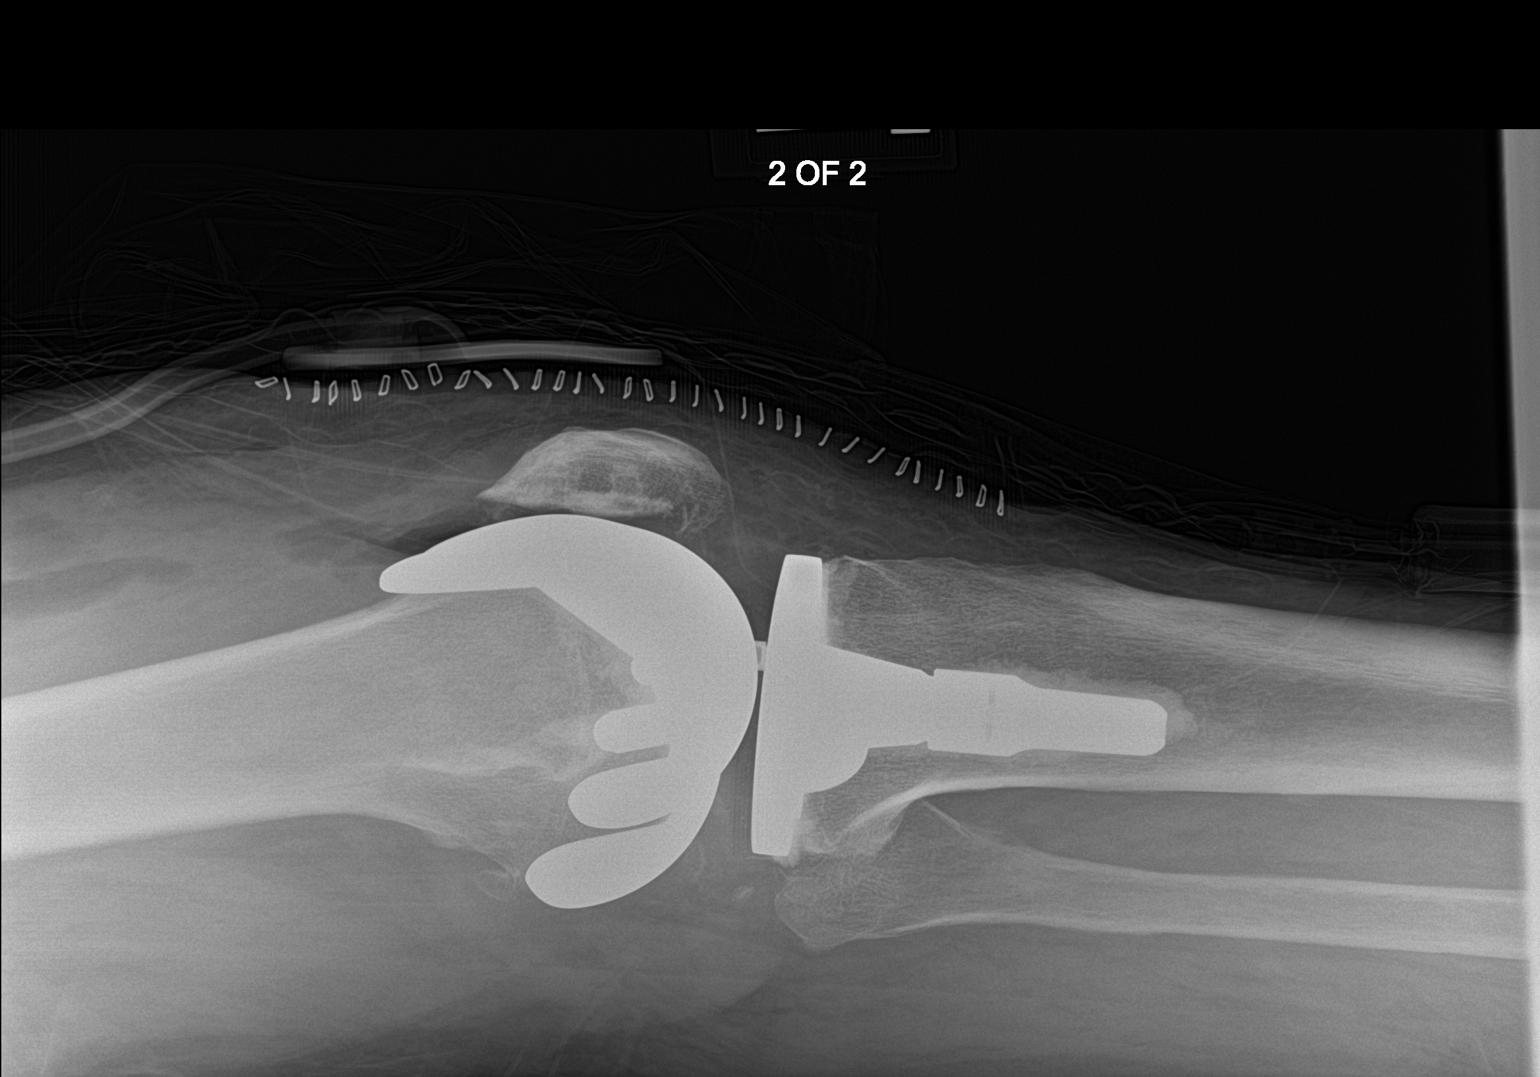

[3 of 3 positions shown; findings below may reference images not displayed]

FINDINGS: The patient is status post right total knee arthroplasty. Femoral
and tibial components are well seated. There is gas and fluid in the
joint. A drain is in place. The knee is located.
IMPRESSION: Right total knee arthroplasty without radiographic evidence for
complication

## 2019-06-02 DIAGNOSIS — G4733 Obstructive sleep apnea (adult) (pediatric): Secondary | ICD-10-CM | POA: Diagnosis not present

## 2019-06-02 DIAGNOSIS — I10 Essential (primary) hypertension: Secondary | ICD-10-CM | POA: Diagnosis not present

## 2019-06-05 DIAGNOSIS — G4733 Obstructive sleep apnea (adult) (pediatric): Secondary | ICD-10-CM | POA: Diagnosis not present

## 2019-07-06 DIAGNOSIS — G4733 Obstructive sleep apnea (adult) (pediatric): Secondary | ICD-10-CM | POA: Diagnosis not present

## 2019-08-05 DIAGNOSIS — G4733 Obstructive sleep apnea (adult) (pediatric): Secondary | ICD-10-CM | POA: Diagnosis not present

## 2019-09-05 DIAGNOSIS — G4733 Obstructive sleep apnea (adult) (pediatric): Secondary | ICD-10-CM | POA: Diagnosis not present

## 2019-11-27 DIAGNOSIS — M1990 Unspecified osteoarthritis, unspecified site: Secondary | ICD-10-CM | POA: Diagnosis not present

## 2019-11-27 DIAGNOSIS — G4733 Obstructive sleep apnea (adult) (pediatric): Secondary | ICD-10-CM | POA: Diagnosis not present

## 2019-11-27 DIAGNOSIS — Z79899 Other long term (current) drug therapy: Secondary | ICD-10-CM | POA: Diagnosis not present

## 2019-11-27 DIAGNOSIS — E785 Hyperlipidemia, unspecified: Secondary | ICD-10-CM | POA: Diagnosis not present

## 2019-11-27 DIAGNOSIS — I10 Essential (primary) hypertension: Secondary | ICD-10-CM | POA: Diagnosis not present

## 2019-12-04 DIAGNOSIS — I10 Essential (primary) hypertension: Secondary | ICD-10-CM | POA: Diagnosis not present

## 2019-12-04 DIAGNOSIS — Z0000 Encounter for general adult medical examination without abnormal findings: Secondary | ICD-10-CM | POA: Diagnosis not present

## 2019-12-04 DIAGNOSIS — G4733 Obstructive sleep apnea (adult) (pediatric): Secondary | ICD-10-CM | POA: Diagnosis not present

## 2019-12-04 DIAGNOSIS — Z0001 Encounter for general adult medical examination with abnormal findings: Secondary | ICD-10-CM | POA: Diagnosis not present

## 2019-12-04 DIAGNOSIS — E785 Hyperlipidemia, unspecified: Secondary | ICD-10-CM | POA: Diagnosis not present

## 2019-12-16 ENCOUNTER — Encounter: Payer: Self-pay | Admitting: Nurse Practitioner

## 2020-01-06 DIAGNOSIS — Z23 Encounter for immunization: Secondary | ICD-10-CM | POA: Diagnosis not present

## 2020-02-04 DIAGNOSIS — G4733 Obstructive sleep apnea (adult) (pediatric): Secondary | ICD-10-CM | POA: Diagnosis not present

## 2020-02-17 ENCOUNTER — Encounter: Payer: Self-pay | Admitting: Nurse Practitioner

## 2020-02-17 ENCOUNTER — Ambulatory Visit: Payer: Medicare Other | Admitting: Nurse Practitioner

## 2020-02-17 ENCOUNTER — Other Ambulatory Visit: Payer: Self-pay

## 2020-02-17 ENCOUNTER — Telehealth: Payer: Self-pay

## 2020-02-17 DIAGNOSIS — Z860101 Personal history of adenomatous and serrated colon polyps: Secondary | ICD-10-CM | POA: Insufficient documentation

## 2020-02-17 DIAGNOSIS — Z86010 Personal history of colonic polyps: Secondary | ICD-10-CM | POA: Insufficient documentation

## 2020-02-17 NOTE — Progress Notes (Signed)
Referring Provider: Asencion Noble, MD Primary Care Physician:  Asencion Noble, MD Primary GI:  Dr. Gala Romney  Chief Complaint  Patient presents with  . Consult    TCS due, last done 2015    HPI:   Joseph Peterson is a 69 y.o. male who presents on referral from primary care to schedule colonoscopy.  Nurse/phone triage was deferred to office visit due to alcohol likely necessitating augmented sedation.  Patient is often seen by our office since 2015.  Previous colonoscopy dated 05/26/2013 which found colonic diverticulosis, two 5 mm polyps in the cecum, otherwise normal.  Surgical pathology found the polyps to be tubular adenoma and recommended 5-year repeat colonoscopy.  Today he states doing okay overall. Denies abdominal pain, N/V, hematochezia, melena, fever, chills, unintentional weight loss. Denies URI or flu-like symptoms. Denies loss of sense of taste or smell. The patient has received COVID-19 vaccination(s). Denies chest pain, dyspnea, dizziness, lightheadedness, syncope, near syncope. Denies any other upper or lower GI symptoms. Denies chest pain, dyspnea, dizziness, lightheadedness, syncope, near syncope. Denies any other upper or lower GI symptoms.  Past Medical History:  Diagnosis Date  . COPD (chronic obstructive pulmonary disease) ([place])   . Hypercholesteremia   . Hypertension     Past Surgical History:  Procedure Laterality Date  . COLONOSCOPY N/A 05/26/2013   Procedure: COLONOSCOPY;  Surgeon: Daneil Dolin, MD;  [facility];  Service: Endoscopy;  Laterality: N/A;  1:00 PM-moved to [place] notified pt  . TOTAL KNEE ARTHROPLASTY Right 05/21/2018   Procedure: TOTAL KNEE ARTHROPLASTY-RIGHT;  Surgeon: Hessie Knows, MD;  Location: [facility] ORS;  Service: Orthopedics;  Laterality: Right;    Current Outpatient Medications  Medication Sig Dispense Refill  . amLODipine (NORVASC) 10 MG tablet Take 10 mg by mouth daily.    . fenofibrate 160 MG tablet Take 160 mg by mouth  daily.    . hydrochlorothiazide (HYDRODIURIL) 25 MG tablet Take 25 mg by mouth daily.     No current facility-administered medications for this visit.    Allergies as of 02/17/2020  . (No Known Allergies)    Family History  Problem Relation Age of Onset  . Colon cancer Neg Hx     Social History   Socioeconomic History  . Marital status: Married    Spouse name: Not on file  . Number of children: Not on file  . Years of education: Not on file  . Highest education level: Not on file  Occupational History  . Not on file  Tobacco Use  . Smoking status: Former Research scientist (life sciences)  . Smokeless tobacco: Former Network engineer and Sexual Activity  . Alcohol use: Yes    Comment: 2-3 beers daily  . Drug use: Never  . Sexual activity: Not on file  Other Topics Concern  . Not on file  Social History Narrative  . Not on file   Social Determinants of Health   Financial Resource Strain:   . Difficulty of Paying Living Expenses: Not on file  Food Insecurity:   . Worried About Charity fundraiser in the Last Year: Not on file  . Ran Out of Food in the Last Year: Not on file  Transportation Needs:   . Lack of Transportation (Medical): Not on file  . Lack of Transportation (Non-Medical): Not on file  Physical Activity:   . Days of Exercise per Week: Not on file  . Minutes of Exercise per Session: Not on file  Stress:   .  Feeling of Stress : Not on file  Social Connections:   . Frequency of Communication with Friends and Family: Not on file  . Frequency of Social Gatherings with Friends and Family: Not on file  . Attends Religious Services: Not on file  . Active Member of Clubs or Organizations: Not on file  . Attends Archivist Meetings: Not on file  . Marital Status: Not on file    Subjective: Review of Systems  Constitutional: Negative for chills, fever, malaise/fatigue and weight loss.  HENT: Negative for congestion and sore throat.   Respiratory: Negative for cough and  shortness of breath.   Cardiovascular: Negative for chest pain and palpitations.  Gastrointestinal: Negative for abdominal pain, blood in stool, diarrhea, melena, nausea and vomiting.  Musculoskeletal: Negative for joint pain and myalgias.  Skin: Negative for rash.  Neurological: Negative for dizziness and weakness.  Endo/Heme/Allergies: Does not bruise/bleed easily.  Psychiatric/Behavioral: Negative for depression. The patient is not nervous/anxious.   All other systems reviewed and are negative.    Objective: BP 140/81   Pulse 72   Temp (!) 97.1 F (36.2 C)   Ht _0  (1.753 m)   Wt 243 lb 9.6 oz (110.5 kg)   BMI 35.97 kg/m  Physical Exam Vitals and nursing note reviewed.  Constitutional:      General: He is not in acute distress.    Appearance: Normal appearance. He is obese. He is not ill-appearing, toxic-appearing or diaphoretic.  HENT:     Head: Normocephalic and atraumatic.     Nose: No congestion or rhinorrhea.  Eyes:     General: No scleral icterus. Cardiovascular:     Rate and Rhythm: Normal rate and regular rhythm.     Heart sounds: Normal heart sounds.  Pulmonary:     Effort: Pulmonary effort is normal.     Breath sounds: Normal breath sounds.  Abdominal:     General: Bowel sounds are normal. There is no distension.     Palpations: Abdomen is soft. There is no hepatomegaly, splenomegaly or mass.     Tenderness: There is no abdominal tenderness. There is no guarding or rebound.     Hernia: No hernia is present.  Musculoskeletal:     Cervical back: Neck supple.  Skin:    General: Skin is warm and dry.     Coloration: Skin is not jaundiced.     Findings: No bruising or rash.  Neurological:     General: No focal deficit present.     Mental Status: He is alert and oriented to person, place, and time. Mental status is at baseline.  Psychiatric:        Mood and Affect: Mood normal.        Behavior: Behavior normal.        Thought Content: Thought content  normal.      Assessment:  Very pleasant 69 year old male presents to schedule a colonoscopy.  He was last seen for colonoscopy in 2015 with found tubular adenoma polyps and recommended a 5-year repeat.  He is currently due.  Generally asymptomatic from a GI standpoint today.  This point we will proceed with colonoscopy.  He will need to have a BMP prior to his procedure due to history of diuretic use.  Proceed with colonoscopy on propofol/MAC by Dr. Gala Romney in near future: the risks, benefits, and alternatives have been discussed with the patient in detail. The patient states understanding and desires to proceed.  The patient is not on  any anticoagulants, anxiolytics, chronic pain medications, antidepressants, antidiabetics, or iron supplements.  He drinks about 3 beers a day, no recreational drugs.  Due to daily alcohol use we will plan for the procedure on propofol/MAC to promote adequate sedation.   Plan: 1. Colonoscopy on propofol 2. Further recommendations to follow 3. Follow-up based on post procedure recommendations.    Thank you for allowing [place] to participate in the care of Roxy I Veronda Prude, DNP, AGNP-C Adult & Gerontological Nurse Practitioner [facility]   02/17/2020 9:07 AM   Disclaimer: This note was dictated with voice recognition software. Similar sounding words can inadvertently be transcribed and may not be corrected upon review.

## 2020-02-17 NOTE — Patient Instructions (Addendum)
Your health issues we discussed today were:   Need for colonoscopy due to history of colon polyps: 1. We will schedule your colonoscopy for you 2. Further recommendations will follow your colonoscopy 3. Let us know if you have any concerning symptoms such as rectal bleeding 4. Further recommendations will follow your colonoscopy  Overall I recommend:  1. Continue your other current medications 2. Return for follow-up based on recommendations made after your procedure 3. Call us for any questions or concerns   ---------------------------------------------------------------  I am glad you have gotten your COVID-19 vaccination!  Even though you are fully vaccinated you should continue to follow CDC and state/local guidelines.  ---------------------------------------------------------------   At [facility] we value your feedback. You may receive a survey about your visit today. Please share your experience as we strive to create trusting relationships with our patients to provide genuine, compassionate, quality care.  We appreciate your understanding and patience as we review any laboratory studies, imaging, and other diagnostic tests that are ordered as we care for you. Our office policy is 5 business days for review of these results, and any emergent or urgent results are addressed in a timely manner for your best interest. If you do not hear from our office in 1 week, please contact us.   We also encourage the use of MyChart, which contains your medical information for your review as well. If you are not enrolled in this feature, an access code is on this after visit summary for your convenience. Thank you for allowing [place] to be involved in your care.  It was great to see you today!  I hope you have a Happy Thanksgiving!!

## 2020-02-17 NOTE — Telephone Encounter (Signed)
Called pt, TCS w/Prop w/Dr. Gala Romney ASA 2 scheduled for 04/08/20 at 10:30am. COVID test 04/06/20. Appt letter mailed with procedure instructions.

## 2020-02-17 NOTE — Progress Notes (Signed)
CC'ED TO PCP 

## 2020-04-06 ENCOUNTER — Other Ambulatory Visit (HOSPITAL_COMMUNITY)
Admission: RE | Admit: 2020-04-06 | Discharge: 2020-04-06 | Disposition: A | Discharge status: Home / Self Care | Payer: Medicare Other | Source: Ambulatory Visit | Attending: Internal Medicine | Admitting: Internal Medicine

## 2020-04-06 ENCOUNTER — Other Ambulatory Visit: Payer: Self-pay

## 2020-04-06 DIAGNOSIS — Z20822 Contact with and (suspected) exposure to covid-19: Secondary | ICD-10-CM | POA: Insufficient documentation

## 2020-04-06 DIAGNOSIS — Z01812 Encounter for preprocedural laboratory examination: Secondary | ICD-10-CM | POA: Insufficient documentation

## 2020-04-06 LAB — SARS CORONAVIRUS 2 (TAT 6-24 HRS): SARS Coronavirus 2: NEGATIVE

## 2020-04-08 ENCOUNTER — Ambulatory Visit (HOSPITAL_COMMUNITY): Payer: Medicare Other | Admitting: Anesthesiology

## 2020-04-08 ENCOUNTER — Encounter (HOSPITAL_COMMUNITY)
Admission: RE | Disposition: A | Discharge status: Home / Self Care | Payer: Self-pay | Source: Home / Self Care | Attending: Internal Medicine

## 2020-04-08 ENCOUNTER — Ambulatory Visit (HOSPITAL_COMMUNITY)
Admission: RE | Admit: 2020-04-08 | Discharge: 2020-04-08 | Disposition: A | Discharge status: Home / Self Care | Payer: Medicare Other | Attending: Internal Medicine | Admitting: Internal Medicine

## 2020-04-08 ENCOUNTER — Encounter (HOSPITAL_COMMUNITY): Payer: Self-pay | Admitting: Internal Medicine

## 2020-04-08 ENCOUNTER — Other Ambulatory Visit: Payer: Self-pay

## 2020-04-08 DIAGNOSIS — Z87891 Personal history of nicotine dependence: Secondary | ICD-10-CM | POA: Insufficient documentation

## 2020-04-08 DIAGNOSIS — J449 Chronic obstructive pulmonary disease, unspecified: Secondary | ICD-10-CM | POA: Diagnosis not present

## 2020-04-08 DIAGNOSIS — Z09 Encounter for follow-up examination after completed treatment for conditions other than malignant neoplasm: Secondary | ICD-10-CM | POA: Diagnosis not present

## 2020-04-08 DIAGNOSIS — Z79899 Other long term (current) drug therapy: Secondary | ICD-10-CM | POA: Diagnosis not present

## 2020-04-08 DIAGNOSIS — Z86010 Personal history of colonic polyps: Secondary | ICD-10-CM

## 2020-04-08 DIAGNOSIS — Z1211 Encounter for screening for malignant neoplasm of colon: Secondary | ICD-10-CM | POA: Insufficient documentation

## 2020-04-08 DIAGNOSIS — D120 Benign neoplasm of cecum: Secondary | ICD-10-CM | POA: Diagnosis not present

## 2020-04-08 DIAGNOSIS — K635 Polyp of colon: Secondary | ICD-10-CM | POA: Diagnosis not present

## 2020-04-08 DIAGNOSIS — K5730 Diverticulosis of large intestine without perforation or abscess without bleeding: Secondary | ICD-10-CM | POA: Insufficient documentation

## 2020-04-08 HISTORY — DX: Sleep apnea, unspecified: G47.30

## 2020-04-08 HISTORY — PX: COLONOSCOPY WITH PROPOFOL: SHX5780

## 2020-04-08 HISTORY — PX: POLYPECTOMY: SHX5525

## 2020-04-08 SURGERY — COLONOSCOPY WITH PROPOFOL
Anesthesia: General

## 2020-04-08 MED ORDER — PROPOFOL 10 MG/ML IV BOLUS
INTRAVENOUS | Status: DC | PRN
  Administered 2020-04-08: 80 mg via INTRAVENOUS
  Administered 2020-04-08: 125 ug/kg/min via INTRAVENOUS

## 2020-04-08 MED ORDER — LACTATED RINGERS IV SOLN: INTRAVENOUS | Status: DC | PRN

## 2020-04-08 MED ORDER — LACTATED RINGERS IV SOLN: Freq: Once | INTRAVENOUS | Status: AC

## 2020-04-08 NOTE — H&P (Signed)
@LOGO @   Primary Care Physician:  Asencion Noble, MD Primary Gastroenterologist:  Dr. Gala Romney  Pre-Procedure History & Physical: HPI:  Joseph Peterson is a 70 y.o. male here for surveillance colonoscopy.  History of multiple colonic adenomas removed previously.  Past Medical History:  Diagnosis Date  . COPD (chronic obstructive pulmonary disease) ([place])   . Hypercholesteremia   . Hypertension   . Sleep apnea     Past Surgical History:  Procedure Laterality Date  . COLONOSCOPY N/A 05/26/2013   Procedure: COLONOSCOPY;  Surgeon: Daneil Dolin, MD;  [facility];  Service: Endoscopy;  Laterality: N/A;  1:00 PM-moved to [employer] notified pt  . TOTAL KNEE ARTHROPLASTY Right 05/21/2018   Procedure: TOTAL KNEE ARTHROPLASTY-RIGHT;  Surgeon: Hessie Knows, MD;  Location: [facility] ORS;  Service: Orthopedics;  Laterality: Right;    Prior to Admission medications   Medication Sig Start Date End Date Taking? Authorizing Provider  amLODipine (NORVASC) 10 MG tablet Take 10 mg by mouth daily.   Yes [provider]  fenofibrate 160 MG tablet Take 160 mg by mouth daily.   Yes [provider]  hydrochlorothiazide (HYDRODIURIL) 25 MG tablet Take 25 mg by mouth daily.   Yes [provider]    Allergies as of 02/17/2020  . (No Known Allergies)    Family History  Problem Relation Age of Onset  . Colon cancer Neg Hx     Social History   Socioeconomic History  . Marital status: Married    Spouse name: Not on file  . Number of children: Not on file  . Years of education: Not on file  . Highest education level: Not on file  Occupational History  . Not on file  Tobacco Use  . Smoking status: Former Research scientist (life sciences)  . Smokeless tobacco: Former Network engineer  . Vaping Use: Never used  Substance and Sexual Activity  . Alcohol use: Yes    Comment: 2-3 beers daily  . Drug use: Never  . Sexual activity: Not on file  Other Topics Concern  . Not on file  Social  History Narrative  . Not on file   Social Determinants of Health   Financial Resource Strain: Not on file  Food Insecurity: Not on file  Transportation Needs: Not on file  Physical Activity: Not on file  Stress: Not on file  Social Connections: Not on file  Intimate Partner Violence: Not on file    Review of Systems: See HPI, otherwise negative ROS  Physical Exam: BP (!) 145/80   Pulse 72   Temp 98.9 F (37.2 C) (Oral)   Resp 17   Ht 5\' 9"  (1.753 m)   Wt 113.4 kg   SpO2 96%   BMI 36.92 kg/m  General:   Alert,  Well-developed, well-nourished, pleasant and cooperative in NAD Neck:  Supple; no masses or thyromegaly. No significant cervical adenopathy. Lungs:  Clear throughout to auscultation.   No wheezes, crackles, or rhonchi. No acute distress. Heart:  Regular rate and rhythm; no murmurs, clicks, rubs,  or gallops. Abdomen: Non-distended, normal bowel sounds.  Soft and nontender without appreciable mass or hepatosplenomegaly.  Pulses:  Normal pulses noted. Extremities:  Without clubbing or edema.  Impression/Plan: 71 year old gentleman with a history of colonic adenomas-here for surveillance colonoscopy per plan.  I have offered the patient a surveillance colonoscopy today..  The risks, benefits, limitations, alternatives and imponderables have been reviewed with the patient. Questions have been answered. All parties are  agreeable.        Notice: This dictation was prepared with Dragon dictation along with smaller phrase technology. Any transcriptional errors that result from this process are unintentional and may not be corrected upon review.

## 2020-04-08 NOTE — Transfer of Care (Signed)
Immediate Anesthesia Transfer of Care Note  Patient: Joseph Peterson  Procedure(s) Performed: COLONOSCOPY WITH PROPOFOL (N/A ) POLYPECTOMY  Patient Location: Endoscopy Unit  Anesthesia Type:General  Level of Consciousness: awake, alert , oriented and patient cooperative  Airway & Oxygen Therapy: Patient Spontanous Breathing  Post-op Assessment: Report given to RN, Post -op Vital signs reviewed and stable and Patient moving all extremities  Post vital signs: Reviewed and stable  Last Vitals:  Vitals Value Taken Time  BP    Temp    Pulse    Resp    SpO2      Last Pain:  Vitals:   04/08/20 1033  TempSrc:   PainSc: 0-No pain      Patients Stated Pain Goal: 7 (36/14/43 1540)  Complications: No complications documented.

## 2020-04-08 NOTE — Op Note (Signed)
[facility] Patient Name: Joseph Peterson Procedure Date: 04/08/2020 10:13 AM MRN: [id number] Date of Birth: April 25, 1950 Attending MD: Norvel Richards , MD CSN: [id number] Age: 70 Admit Type: Outpatient Procedure:                Colonoscopy Indications:              High risk colon cancer surveillance: Personal                            history of colonic polyps Providers:                Norvel Richards, MD, Gwenlyn Fudge, RN, Rosina Lowenstein, RN Referring MD:              Medicines:                Propofol per Anesthesia Complications:            No immediate complications. Estimated Blood Loss:     Estimated blood loss was minimal. Estimated blood                            loss: none. Estimated blood loss was minimal. Procedure:                Pre-Anesthesia Assessment:                           - Prior to the procedure, a History and Physical                            was performed, and patient medications and                            allergies were reviewed. The patient's tolerance of                            previous anesthesia was also reviewed. The risks                            and benefits of the procedure and the sedation                            options and risks were discussed with the patient.                            All questions were answered, and informed consent                            was obtained. Prior Anticoagulants: The patient has                            taken no previous anticoagulant or antiplatelet  agents. ASA Grade Assessment: II - A patient with                            mild systemic disease. After reviewing the risks                            and benefits, the patient was deemed in                            satisfactory condition to undergo the procedure.                           After obtaining informed consent, the colonoscope                            was passed  under direct vision. Throughout the                            procedure, the patient's blood pressure, pulse, and                            oxygen saturations were monitored continuously. The                            CF-[id number] ([id number]) scope was introduced through                            the anus and advanced to the the cecum, identified                            by appendiceal orifice and ileocecal valve. The                            colonoscopy was performed without difficulty. The                            patient tolerated the procedure well. The quality                            of the bowel preparation was adequate. Scope In: 10:34:37 AM Scope Out: 10:49:55 AM Scope Withdrawal Time: 0 hours 10 minutes 34 seconds  Total Procedure Duration: 0 hours 15 minutes 18 seconds  Findings:      The perianal and digital rectal examinations were normal.      Scattered medium-mouthed diverticula were found in the sigmoid colon.      A 4 mm polyp was found in the cecum. The polyp was sessile. The polyp       was removed with a cold snare. Resection and retrieval were complete.       Estimated blood loss was minimal.      The exam was otherwise without abnormality on direct and retroflexion       views. Impression:               - Diverticulosis in the sigmoid colon.                           -  One 4 mm polyp in the cecum, removed with a cold                            snare. Resected and retrieved.                           - The examination was otherwise normal on direct                            and retroflexion views. Moderate Sedation:      Moderate (conscious) sedation was personally administered by an       anesthesia professional. The following parameters were monitored: oxygen       saturation, heart rate, blood pressure, respiratory rate, EKG, adequacy       of pulmonary ventilation, and response to care. Recommendation:           - Patient has a contact number available  for                            emergencies. The signs and symptoms of potential                            delayed complications were discussed with the                            patient. Return to normal activities tomorrow.                            Written discharge instructions were provided to the                            patient.                           - Resume previous diet.                           - Continue present medications.                           - Repeat colonoscopy date to be determined after                            pending pathology results are reviewed for                            surveillance.                           - Return to GI office (date not yet determined). Procedure Code(s):        --- Professional ---                           [phone], Colonoscopy, flexible; with removal of  tumor(s), polyp(s), or other lesion(s) by snare                            technique Diagnosis Code(s):        --- Professional ---                           Z86.010, Personal history of colonic polyps                           K63.5, Polyp of colon                           K57.30, Diverticulosis of large intestine without                            perforation or abscess without bleeding CPT copyright 2019 American Medical Association. All rights reserved. The codes documented in this report are preliminary and upon coder review may  be revised to meet current compliance requirements. Joseph Peterson. Joseph Vetrano, MD Norvel Richards, MD 04/08/2020 10:59:29 AM This report has been signed electronically. Number of Addenda: 0

## 2020-04-08 NOTE — Anesthesia Postprocedure Evaluation (Signed)
Anesthesia Post Note  Patient: Joseph Peterson  Procedure(s) Performed: COLONOSCOPY WITH PROPOFOL (N/A ) POLYPECTOMY  Patient location during evaluation: Endoscopy Anesthesia Type: General Level of consciousness: oriented, awake, awake and alert and patient cooperative Pain management: pain level controlled Vital Signs Assessment: post-procedure vital signs reviewed and stable Respiratory status: spontaneous breathing, respiratory function stable and nonlabored ventilation Cardiovascular status: stable and blood pressure returned to baseline Postop Assessment: no headache and no backache Anesthetic complications: no   No complications documented.   Last Vitals:  Vitals:   04/08/20 0919  BP: (!) 145/80  Pulse: 72  Resp: 17  Temp: 37.2 C  SpO2: 96%    Last Pain:  Vitals:   04/08/20 1033  TempSrc:   PainSc: 0-No pain                 Tacy Learn

## 2020-04-08 NOTE — Anesthesia Preprocedure Evaluation (Signed)
Anesthesia Evaluation  Patient identified by MRN, date of birth, ID band Patient awake    Reviewed: Allergy & Precautions, NPO status , Patient's Chart, lab work & pertinent test results  History of Anesthesia Complications Negative for: history of anesthetic complications  Airway Mallampati: II  TM Distance: >3 FB Neck ROM: Full    Dental  (+) Dental Advisory Given, Teeth Intact   Pulmonary sleep apnea and Continuous Positive Airway Pressure Ventilation , COPD, former smoker,    Pulmonary exam normal breath sounds clear to auscultation       Cardiovascular Exercise Tolerance: Good hypertension, Pt. on medications Normal cardiovascular exam Rhythm:Regular Rate:Normal     Neuro/Psych negative neurological ROS  negative psych ROS   GI/Hepatic Bowel prep,(+)     substance abuse  alcohol use,   Endo/Other  negative endocrine ROS  Renal/GU negative Renal ROS     Musculoskeletal negative musculoskeletal ROS (+)   Abdominal   Peds  Hematology negative hematology ROS (+)   Anesthesia Other Findings   Reproductive/Obstetrics negative OB ROS                             Anesthesia Physical Anesthesia Plan  ASA: II  Anesthesia Plan: General   Post-op Pain Management:    Induction: Intravenous  PONV Risk Score and Plan: TIVA  Airway Management Planned: Nasal Cannula and Natural Airway  Additional Equipment:   Intra-op Plan:   Post-operative Plan:   Informed Consent: I have reviewed the patients History and Physical, chart, labs and discussed the procedure including the risks, benefits and alternatives for the proposed anesthesia with the patient or authorized representative who has indicated his/her understanding and acceptance.     Dental advisory given  Plan Discussed with: CRNA and Surgeon  Anesthesia Plan Comments:         Anesthesia Quick Evaluation

## 2020-04-08 NOTE — Discharge Instructions (Signed)
Colonoscopy Discharge Instructions  Read the instructions outlined below and refer to this sheet in the next few weeks. These discharge instructions provide you with general information on caring for yourself after you leave the hospital. Your doctor may also give you specific instructions. While your treatment has been planned according to the most current medical practices available, unavoidable complications occasionally occur. If you have any problems or questions after discharge, call Dr. Gala Romney at [phone]. ACTIVITY  You may resume your regular activity, but move at a slower pace for the next 24 hours.   Take frequent rest periods for the next 24 hours.   Walking will help get rid of the air and reduce the bloated feeling in your belly (abdomen).   No driving for 24 hours (because of the medicine (anesthesia) used during the test).    Do not sign any important legal documents or operate any machinery for 24 hours (because of the anesthesia used during the test).  NUTRITION  Drink plenty of fluids.   You may resume your normal diet as instructed by your doctor.   Begin with a light meal and progress to your normal diet. Heavy or fried foods are harder to digest and may make you feel sick to your stomach (nauseated).   Avoid alcoholic beverages for 24 hours or as instructed.  MEDICATIONS  You may resume your normal medications unless your doctor tells you otherwise.  WHAT YOU CAN EXPECT TODAY  Some feelings of bloating in the abdomen.   Passage of more gas than usual.   Spotting of blood in your stool or on the toilet paper.  IF YOU HAD POLYPS REMOVED DURING THE COLONOSCOPY:  No aspirin products for 7 days or as instructed.   No alcohol for 7 days or as instructed.   Eat a soft diet for the next 24 hours.  FINDING OUT THE RESULTS OF YOUR TEST Not all test results are available during your visit. If your test results are not back during the visit, make an appointment  with your caregiver to find out the results. Do not assume everything is normal if you have not heard from your caregiver or the medical facility. It is important for you to follow up on all of your test results.  SEEK IMMEDIATE MEDICAL ATTENTION IF:  You have more than a spotting of blood in your stool.   Your belly is swollen (abdominal distention).   You are nauseated or vomiting.   You have a temperature over 101.   You have abdominal pain or discomfort that is severe or gets worse throughout the day.    1 small polyp removed from your colon today  Diverticulosis also found-mild  Information on diverticulosis and colon polyps provided  Further recommendations to follow pending review of pathology report  At patient request, I called Lazer Meloche at [phone] -got voicemail -left message \  Colon Polyps  Colon polyps are tissue growths inside the colon, which is part of the large intestine. They are one of the types of polyps that can grow in the body. A polyp may be a round bump or a mushroom-shaped growth. You could have one polyp or more than one. Most colon polyps are noncancerous (benign). However, some colon polyps can become cancerous over time. Finding and removing the polyps early can help prevent this. What are the causes? The exact cause of colon polyps is not known. What increases the risk? The following factors may make you more likely to develop  this condition:  Having a family history of colorectal cancer or colon polyps.  Being older than 71 years of age.  Being younger than 71 years of age and having a significant family history of colorectal cancer or colon polyps or a genetic condition that puts you at higher risk of getting colon polyps.  Having inflammatory bowel disease, such as ulcerative colitis or Crohn's disease.  Having certain conditions passed from parent to child (hereditary conditions), such as: ? Familial adenomatous polyposis  (FAP). ? Lynch syndrome. ? Turcot syndrome. ? Peutz-Jeghers syndrome. ? MUTYH-associated polyposis (MAP).  Being overweight.  Certain lifestyle factors. These include smoking cigarettes, drinking too much alcohol, not getting enough exercise, and eating a diet that is high in fat and red meat and low in fiber.  Having had childhood cancer that was treated with radiation of the abdomen. What are the signs or symptoms? Many times, there are no symptoms. If you have symptoms, they may include:  Blood coming from the rectum during a bowel movement.  Blood in the stool (feces). The blood may be bright red or very dark in color.  Pain in the abdomen.  A change in bowel habits, such as constipation or diarrhea. How is this diagnosed? This condition is diagnosed with a colonoscopy. This is a procedure in which a lighted, flexible scope is inserted into the opening between the buttocks (anus) and then passed into the colon to examine the area. Polyps are sometimes found when a colonoscopy is done as part of routine cancer screening tests. How is this treated? This condition is treated by removing any polyps that are found. Most polyps can be removed during a colonoscopy. Those polyps will then be tested for cancer. Additional treatment may be needed depending on the results of testing. Follow these instructions at home: Eating and drinking  Eat foods that are high in fiber, such as fruits, vegetables, and whole grains.  Eat foods that are high in calcium and vitamin D, such as milk, cheese, yogurt, eggs, liver, fish, and broccoli.  Limit foods that are high in fat, such as fried foods and desserts.  Limit the amount of red meat, precooked or cured meat, or other processed meat that you eat, such as hot dogs, sausages, bacon, or meat loaves.  Limit sugary drinks.   Lifestyle  Maintain a healthy weight, or lose weight if recommended by your health care provider.  Exercise every day or  as told by your health care provider.  Do not use any products that contain nicotine or tobacco, such as cigarettes, e-cigarettes, and chewing tobacco. If you need help quitting, ask your health care provider.  Do not drink alcohol if: ? Your health care provider tells you not to drink. ? You are pregnant, may be pregnant, or are planning to become pregnant.  If you drink alcohol: ? Limit how much you use to:  0-1 drink a day for women.  0-2 drinks a day for men. ? Know how much alcohol is in your drink. In the [place] one drink equals one 12 oz bottle of beer (355 mL), one 5 oz glass of wine (148 mL), or one 1 oz glass of hard liquor (44 mL). General instructions  Take over-the-counter and prescription medicines only as told by your health care provider.  Keep all follow-up visits. This is important. This includes having regularly scheduled colonoscopies. Talk to your health care provider about when you need a colonoscopy. Contact a health care provider if:  You have new or worsening bleeding during a bowel movement.  You have new or increased blood in your stool.  You have a change in bowel habits.  You lose weight for no known reason. Summary  Colon polyps are tissue growths inside the colon, which is part of the large intestine. They are one type of polyp that can grow in the body.  Most colon polyps are noncancerous (benign), but some can become cancerous over time.  This condition is diagnosed with a colonoscopy.  This condition is treated by removing any polyps that are found. Most polyps can be removed during a colonoscopy. This information is not intended to replace advice given to you by your health care provider. Make sure you discuss any questions you have with your health care provider. Document Revised: 07/02/2019 Document Reviewed: 07/02/2019 Elsevier Patient Education  2021 [place].    Diverticulosis  Diverticulosis is a condition that develops when  small pouches (diverticula) form in the wall of the large intestine (colon). The colon is where water is absorbed and stool (feces) is formed. The pouches form when the inside layer of the colon pushes through weak spots in the outer layers of the colon. You may have a few pouches or many of them. The pouches usually do not cause problems unless they become inflamed or infected. When this happens, the condition is called diverticulitis. What are the causes? The cause of this condition is not known. What increases the risk? The following factors may make you more likely to develop this condition:  Being older than age 57. Your risk for this condition increases with age. Diverticulosis is rare among people younger than age 36. By age 40, many people have it.  Eating a low-fiber diet.  Having frequent constipation.  Being overweight.  Not getting enough exercise.  Smoking.  Taking over-the-counter pain medicines, like aspirin and ibuprofen.  Having a family history of diverticulosis. What are the signs or symptoms? In most people, there are no symptoms of this condition. If you do have symptoms, they may include:  Bloating.  Cramps in the abdomen.  Constipation or diarrhea.  Pain in the lower left side of the abdomen. How is this diagnosed? Because diverticulosis usually has no symptoms, it is most often diagnosed during an exam for other colon problems. The condition may be diagnosed by:  Using a flexible scope to examine the colon (colonoscopy).  Taking an X-ray of the colon after dye has been put into the colon (barium enema).  Having a CT scan. How is this treated? You may not need treatment for this condition. Your health care provider may recommend treatment to prevent problems. You may need treatment if you have symptoms or if you previously had diverticulitis. Treatment may include:  Eating a high-fiber diet.  Taking a fiber supplement.  Taking a live bacteria  supplement (probiotic).  Taking medicine to relax your colon.   Follow these instructions at home: Medicines  Take over-the-counter and prescription medicines only as told by your health care provider.  If told by your health care provider, take a fiber supplement or probiotic. Constipation prevention Your condition may cause constipation. To prevent or treat constipation, you may need to:  Drink enough fluid to keep your urine pale yellow.  Take over-the-counter or prescription medicines.  Eat foods that are high in fiber, such as beans, whole grains, and fresh fruits and vegetables.  Limit foods that are high in fat and processed sugars, such as fried  or sweet foods.   General instructions  Try not to strain when you have a bowel movement.  Keep all follow-up visits as told by your health care provider. This is important. Contact a health care provider if you:  Have pain in your abdomen.  Have bloating.  Have cramps.  Have not had a bowel movement in 3 days. Get help right away if:  Your pain gets worse.  Your bloating becomes very bad.  You have a fever or chills, and your symptoms suddenly get worse.  You vomit.  You have bowel movements that are bloody or black.  You have bleeding from your rectum. Summary  Diverticulosis is a condition that develops when small pouches (diverticula) form in the wall of the large intestine (colon).  You may have a few pouches or many of them.  This condition is most often diagnosed during an exam for other colon problems.  Treatment may include increasing the fiber in your diet, taking supplements, or taking medicines. This information is not intended to replace advice given to you by your health care provider. Make sure you discuss any questions you have with your health care provider. Document Revised: 10/10/2018 Document Reviewed: 10/10/2018 Elsevier Patient Education  [place].

## 2020-04-09 ENCOUNTER — Encounter: Payer: Self-pay | Admitting: Internal Medicine

## 2020-04-09 LAB — SURGICAL PATHOLOGY

## 2020-04-15 ENCOUNTER — Encounter (HOSPITAL_COMMUNITY): Payer: Self-pay | Admitting: Internal Medicine

## 2020-06-02 DIAGNOSIS — G4733 Obstructive sleep apnea (adult) (pediatric): Secondary | ICD-10-CM | POA: Diagnosis not present

## 2020-06-02 DIAGNOSIS — L200 Besnier's prurigo: Secondary | ICD-10-CM | POA: Diagnosis not present

## 2020-06-02 DIAGNOSIS — I10 Essential (primary) hypertension: Secondary | ICD-10-CM | POA: Diagnosis not present

## 2020-08-06 DIAGNOSIS — G4733 Obstructive sleep apnea (adult) (pediatric): Secondary | ICD-10-CM | POA: Diagnosis not present

## 2021-01-07 DIAGNOSIS — E785 Hyperlipidemia, unspecified: Secondary | ICD-10-CM | POA: Diagnosis not present

## 2021-01-07 DIAGNOSIS — Z79899 Other long term (current) drug therapy: Secondary | ICD-10-CM | POA: Diagnosis not present

## 2021-01-07 DIAGNOSIS — M1990 Unspecified osteoarthritis, unspecified site: Secondary | ICD-10-CM | POA: Diagnosis not present

## 2021-01-07 DIAGNOSIS — I10 Essential (primary) hypertension: Secondary | ICD-10-CM | POA: Diagnosis not present

## 2021-01-07 DIAGNOSIS — G4733 Obstructive sleep apnea (adult) (pediatric): Secondary | ICD-10-CM | POA: Diagnosis not present

## 2021-01-11 DIAGNOSIS — I10 Essential (primary) hypertension: Secondary | ICD-10-CM | POA: Diagnosis not present

## 2021-01-11 DIAGNOSIS — Z23 Encounter for immunization: Secondary | ICD-10-CM | POA: Diagnosis not present

## 2021-01-11 DIAGNOSIS — Z0001 Encounter for general adult medical examination with abnormal findings: Secondary | ICD-10-CM | POA: Diagnosis not present

## 2021-01-11 DIAGNOSIS — E785 Hyperlipidemia, unspecified: Secondary | ICD-10-CM | POA: Diagnosis not present

## 2021-05-24 DIAGNOSIS — I10 Essential (primary) hypertension: Secondary | ICD-10-CM | POA: Diagnosis not present

## 2021-05-24 DIAGNOSIS — E785 Hyperlipidemia, unspecified: Secondary | ICD-10-CM | POA: Diagnosis not present

## 2021-07-15 DIAGNOSIS — G4733 Obstructive sleep apnea (adult) (pediatric): Secondary | ICD-10-CM | POA: Diagnosis not present

## 2021-07-15 DIAGNOSIS — I10 Essential (primary) hypertension: Secondary | ICD-10-CM | POA: Diagnosis not present

## 2022-01-11 DIAGNOSIS — E785 Hyperlipidemia, unspecified: Secondary | ICD-10-CM | POA: Diagnosis not present

## 2022-01-11 DIAGNOSIS — G4733 Obstructive sleep apnea (adult) (pediatric): Secondary | ICD-10-CM | POA: Diagnosis not present

## 2022-01-11 DIAGNOSIS — I10 Essential (primary) hypertension: Secondary | ICD-10-CM | POA: Diagnosis not present

## 2022-01-11 DIAGNOSIS — M1990 Unspecified osteoarthritis, unspecified site: Secondary | ICD-10-CM | POA: Diagnosis not present

## 2022-01-17 DIAGNOSIS — I10 Essential (primary) hypertension: Secondary | ICD-10-CM | POA: Diagnosis not present

## 2022-01-17 DIAGNOSIS — Z0000 Encounter for general adult medical examination without abnormal findings: Secondary | ICD-10-CM | POA: Diagnosis not present

## 2022-01-17 DIAGNOSIS — Z23 Encounter for immunization: Secondary | ICD-10-CM | POA: Diagnosis not present

## 2022-01-17 DIAGNOSIS — G4733 Obstructive sleep apnea (adult) (pediatric): Secondary | ICD-10-CM | POA: Diagnosis not present

## 2022-02-15 DIAGNOSIS — N390 Urinary tract infection, site not specified: Secondary | ICD-10-CM | POA: Diagnosis not present

## 2022-02-20 DIAGNOSIS — Z79899 Other long term (current) drug therapy: Secondary | ICD-10-CM | POA: Diagnosis not present

## 2022-02-20 DIAGNOSIS — I10 Essential (primary) hypertension: Secondary | ICD-10-CM | POA: Diagnosis not present

## 2022-07-27 DIAGNOSIS — G4733 Obstructive sleep apnea (adult) (pediatric): Secondary | ICD-10-CM | POA: Diagnosis not present

## 2022-07-27 DIAGNOSIS — I10 Essential (primary) hypertension: Secondary | ICD-10-CM | POA: Diagnosis not present

## 2023-01-19 DIAGNOSIS — E785 Hyperlipidemia, unspecified: Secondary | ICD-10-CM | POA: Diagnosis not present

## 2023-01-19 DIAGNOSIS — I10 Essential (primary) hypertension: Secondary | ICD-10-CM | POA: Diagnosis not present

## 2023-01-19 DIAGNOSIS — Z79899 Other long term (current) drug therapy: Secondary | ICD-10-CM | POA: Diagnosis not present

## 2023-01-19 DIAGNOSIS — G4733 Obstructive sleep apnea (adult) (pediatric): Secondary | ICD-10-CM | POA: Diagnosis not present

## 2023-03-01 DIAGNOSIS — Z0001 Encounter for general adult medical examination with abnormal findings: Secondary | ICD-10-CM | POA: Diagnosis not present

## 2023-03-01 DIAGNOSIS — Z0000 Encounter for general adult medical examination without abnormal findings: Secondary | ICD-10-CM | POA: Diagnosis not present

## 2023-03-01 DIAGNOSIS — R7303 Prediabetes: Secondary | ICD-10-CM | POA: Diagnosis not present

## 2023-03-01 DIAGNOSIS — M1990 Unspecified osteoarthritis, unspecified site: Secondary | ICD-10-CM | POA: Diagnosis not present

## 2023-03-01 DIAGNOSIS — G4733 Obstructive sleep apnea (adult) (pediatric): Secondary | ICD-10-CM | POA: Diagnosis not present

## 2023-03-01 DIAGNOSIS — E785 Hyperlipidemia, unspecified: Secondary | ICD-10-CM | POA: Diagnosis not present

## 2023-03-01 DIAGNOSIS — Z23 Encounter for immunization: Secondary | ICD-10-CM | POA: Diagnosis not present

## 2023-03-01 DIAGNOSIS — I10 Essential (primary) hypertension: Secondary | ICD-10-CM | POA: Diagnosis not present

## 2023-05-28 ENCOUNTER — Emergency Department (HOSPITAL_COMMUNITY)

## 2023-05-28 ENCOUNTER — Observation Stay (HOSPITAL_COMMUNITY)
Admission: EM | Admit: 2023-05-28 | Discharge: 2023-05-30 | Disposition: A | Discharge status: Home / Self Care | Attending: Family Medicine | Admitting: Family Medicine

## 2023-05-28 ENCOUNTER — Encounter (HOSPITAL_COMMUNITY): Payer: Self-pay

## 2023-05-28 ENCOUNTER — Ambulatory Visit: Admission: EM | Admit: 2023-05-28 | Discharge: 2023-05-28 | Disposition: A | Discharge status: Home / Self Care

## 2023-05-28 ENCOUNTER — Other Ambulatory Visit: Payer: Self-pay

## 2023-05-28 DIAGNOSIS — I6782 Cerebral ischemia: Secondary | ICD-10-CM | POA: Diagnosis not present

## 2023-05-28 DIAGNOSIS — Z7982 Long term (current) use of aspirin: Secondary | ICD-10-CM | POA: Insufficient documentation

## 2023-05-28 DIAGNOSIS — E785 Hyperlipidemia, unspecified: Secondary | ICD-10-CM | POA: Diagnosis not present

## 2023-05-28 DIAGNOSIS — J449 Chronic obstructive pulmonary disease, unspecified: Secondary | ICD-10-CM | POA: Diagnosis present

## 2023-05-28 DIAGNOSIS — R531 Weakness: Secondary | ICD-10-CM | POA: Diagnosis not present

## 2023-05-28 DIAGNOSIS — I639 Cerebral infarction, unspecified: Principal | ICD-10-CM | POA: Diagnosis present

## 2023-05-28 DIAGNOSIS — Z87891 Personal history of nicotine dependence: Secondary | ICD-10-CM | POA: Diagnosis not present

## 2023-05-28 DIAGNOSIS — G4733 Obstructive sleep apnea (adult) (pediatric): Secondary | ICD-10-CM | POA: Insufficient documentation

## 2023-05-28 DIAGNOSIS — M25552 Pain in left hip: Secondary | ICD-10-CM | POA: Diagnosis not present

## 2023-05-28 DIAGNOSIS — Z79899 Other long term (current) drug therapy: Secondary | ICD-10-CM | POA: Diagnosis not present

## 2023-05-28 DIAGNOSIS — R41841 Cognitive communication deficit: Secondary | ICD-10-CM | POA: Diagnosis not present

## 2023-05-28 DIAGNOSIS — Z7902 Long term (current) use of antithrombotics/antiplatelets: Secondary | ICD-10-CM | POA: Insufficient documentation

## 2023-05-28 DIAGNOSIS — Z6835 Body mass index (BMI) 35.0-35.9, adult: Secondary | ICD-10-CM | POA: Diagnosis not present

## 2023-05-28 DIAGNOSIS — R29818 Other symptoms and signs involving the nervous system: Secondary | ICD-10-CM | POA: Diagnosis not present

## 2023-05-28 DIAGNOSIS — M47816 Spondylosis without myelopathy or radiculopathy, lumbar region: Secondary | ICD-10-CM | POA: Diagnosis not present

## 2023-05-28 DIAGNOSIS — G4730 Sleep apnea, unspecified: Secondary | ICD-10-CM | POA: Diagnosis present

## 2023-05-28 DIAGNOSIS — R29810 Facial weakness: Secondary | ICD-10-CM | POA: Diagnosis not present

## 2023-05-28 DIAGNOSIS — E66812 Obesity, class 2: Secondary | ICD-10-CM | POA: Insufficient documentation

## 2023-05-28 DIAGNOSIS — I6389 Other cerebral infarction: Principal | ICD-10-CM | POA: Insufficient documentation

## 2023-05-28 DIAGNOSIS — I10 Essential (primary) hypertension: Secondary | ICD-10-CM | POA: Diagnosis present

## 2023-05-28 DIAGNOSIS — Z96651 Presence of right artificial knee joint: Secondary | ICD-10-CM | POA: Diagnosis not present

## 2023-05-28 LAB — CBC WITH DIFFERENTIAL/PLATELET
Abs Immature Granulocytes: 0.02 10*3/uL (ref 0.00–0.07)
Basophils Absolute: 0.1 10*3/uL (ref 0.0–0.1)
Basophils Relative: 1 %
Eosinophils Absolute: 0.1 10*3/uL (ref 0.0–0.5)
Eosinophils Relative: 1 %
HCT: 50.8 % (ref 39.0–52.0)
Hemoglobin: 17.4 g/dL — ABNORMAL HIGH (ref 13.0–17.0)
Immature Granulocytes: 0 %
Lymphocytes Relative: 20 %
Lymphs Abs: 1.7 10*3/uL (ref 0.7–4.0)
MCH: 30.5 pg (ref 26.0–34.0)
MCHC: 34.3 g/dL (ref 30.0–36.0)
MCV: 89.1 fL (ref 80.0–100.0)
Monocytes Absolute: 0.7 10*3/uL (ref 0.1–1.0)
Monocytes Relative: 9 %
Neutro Abs: 5.7 10*3/uL (ref 1.7–7.7)
Neutrophils Relative %: 69 %
Platelets: 272 10*3/uL (ref 150–400)
RBC: 5.7 MIL/uL (ref 4.22–5.81)
RDW: 12.7 % (ref 11.5–15.5)
WBC: 8.2 10*3/uL (ref 4.0–10.5)
nRBC: 0 % (ref 0.0–0.2)

## 2023-05-28 LAB — URINALYSIS, ROUTINE W REFLEX MICROSCOPIC
Bilirubin Urine: NEGATIVE
Glucose, UA: NEGATIVE mg/dL
Hgb urine dipstick: NEGATIVE
Ketones, ur: NEGATIVE mg/dL
Leukocytes,Ua: NEGATIVE
Nitrite: NEGATIVE
Protein, ur: NEGATIVE mg/dL
Specific Gravity, Urine: 1.01 (ref 1.005–1.030)
pH: 6 (ref 5.0–8.0)

## 2023-05-28 LAB — COMPREHENSIVE METABOLIC PANEL
ALT: 37 U/L (ref 0–44)
AST: 32 U/L (ref 15–41)
Albumin: 4.3 g/dL (ref 3.5–5.0)
Alkaline Phosphatase: 31 U/L — ABNORMAL LOW (ref 38–126)
Anion gap: 13 (ref 5–15)
BUN: 14 mg/dL (ref 8–23)
CO2: 25 mmol/L (ref 22–32)
Calcium: 9.9 mg/dL (ref 8.9–10.3)
Chloride: 100 mmol/L (ref 98–111)
Creatinine, Ser: 1.04 mg/dL (ref 0.61–1.24)
GFR, Estimated: >60 mL/min (ref >60)
Glucose, Bld: 119 mg/dL — ABNORMAL HIGH (ref 70–99)
Potassium: 3.6 mmol/L (ref 3.5–5.1)
Sodium: 138 mmol/L (ref 135–145)
Total Bilirubin: 1 mg/dL (ref 0.0–1.2)
Total Protein: 8 g/dL (ref 6.5–8.1)

## 2023-05-28 LAB — CBG MONITORING, ED: Glucose-Capillary: 124 mg/dL — ABNORMAL HIGH (ref 70–99)

## 2023-05-28 MED ORDER — CLOPIDOGREL BISULFATE 75 MG PO TABS
75.0000 mg | ORAL_TABLET | Freq: Every day | ORAL | Status: DC
  Administered 2023-05-29 – 2023-05-30 (×2): 75 mg via ORAL
  Filled 2023-05-28 (×2): qty 1

## 2023-05-28 MED ORDER — ACETAMINOPHEN 160 MG/5ML PO SOLN: 650.0000 mg | ORAL | Status: DC | PRN

## 2023-05-28 MED ORDER — ACETAMINOPHEN 650 MG RE SUPP: 650.0000 mg | RECTAL | Status: DC | PRN

## 2023-05-28 MED ORDER — LORAZEPAM 1 MG PO TABS: 1.0000 mg | ORAL_TABLET | ORAL | Status: DC | PRN

## 2023-05-28 MED ORDER — CHLORHEXIDINE GLUCONATE CLOTH 2 % EX PADS
6.0000 | MEDICATED_PAD | Freq: Every day | CUTANEOUS | Status: DC
  Administered 2023-05-28 – 2023-05-30 (×2): 6 via TOPICAL

## 2023-05-28 MED ORDER — ASPIRIN 325 MG PO TABS
325.0000 mg | ORAL_TABLET | Freq: Once | ORAL | Status: AC
  Administered 2023-05-28: 325 mg via ORAL
  Filled 2023-05-28: qty 1

## 2023-05-28 MED ORDER — CLOPIDOGREL BISULFATE 75 MG PO TABS
300.0000 mg | ORAL_TABLET | Freq: Once | ORAL | Status: AC
  Administered 2023-05-28: 300 mg via ORAL
  Filled 2023-05-28: qty 4

## 2023-05-28 MED ORDER — ASPIRIN 81 MG PO TBEC
81.0000 mg | DELAYED_RELEASE_TABLET | Freq: Every day | ORAL | Status: DC
  Administered 2023-05-29 – 2023-05-30 (×2): 81 mg via ORAL
  Filled 2023-05-28 (×2): qty 1

## 2023-05-28 MED ORDER — SENNOSIDES-DOCUSATE SODIUM 8.6-50 MG PO TABS: 1.0000 | ORAL_TABLET | Freq: Every evening | ORAL | Status: DC | PRN

## 2023-05-28 MED ORDER — ENOXAPARIN SODIUM 40 MG/0.4ML IJ SOSY
40.0000 mg | PREFILLED_SYRINGE | INTRAMUSCULAR | Status: DC
Start: 2023-05-28 — End: 2023-05-30
  Administered 2023-05-28 – 2023-05-29 (×2): 40 mg via SUBCUTANEOUS
  Filled 2023-05-28 (×2): qty 0.4

## 2023-05-28 MED ORDER — ACETAMINOPHEN 325 MG PO TABS: 650.0000 mg | ORAL_TABLET | ORAL | Status: DC | PRN

## 2023-05-28 MED ORDER — STROKE: EARLY STAGES OF RECOVERY BOOK
Freq: Once | Status: AC
  Administered 2023-05-29: 1
  Filled 2023-05-28: qty 1

## 2023-05-28 NOTE — ED Notes (Signed)
 Left side weakness x 2 days. Pt states he is having trouble walking with his left leg and decreased grip and not able to hold on to things as well.   No recent falls or injury to left side.

## 2023-05-28 NOTE — ED Notes (Signed)
 Patient requesting to eat at this time, this RN messaged provider about patient eating as long as he passes swallow screen.

## 2023-05-28 NOTE — ED Provider Notes (Signed)
 [place] EMERGENCY DEPARTMENT AT [facility] Provider Note   CSN: [id number] Arrival date & time: 05/28/23  1510     History  Chief Complaint  Patient presents with   Weakness    Joseph Peterson is a 73 y.o. male.  73 year old male with past medical history of hypertension hyperlipidemia presents emergency department today with left-sided weakness.  Patient reports he first noticed this 2 days ago.  Has been relatively mild.  He reports this persisted so he went to urgent care today was subsequently sent to the ER for further evaluation.  He denies any headache with this.  He states that he is feeling slightly weak in his left arm and leg.  Denies any speech difficulties.  He has been able to ambulate but states that this was more difficult than normal over the past few days.   Weakness      Home Medications Prior to Admission medications   Medication Sig Start Date End Date Taking? Authorizing Provider  amLODipine (NORVASC) 10 MG tablet Take 10 mg by mouth daily.   Yes [provider]  fenofibrate 160 MG tablet Take 160 mg by mouth daily.   Yes [provider]  hydrochlorothiazide (HYDRODIURIL) 25 MG tablet Take 25 mg by mouth daily.   Yes [provider]      Allergies    Patient has no known allergies.    Review of Systems   Review of Systems  Neurological:  Positive for weakness.  All other systems reviewed and are negative.   Physical Exam Updated Vital Signs BP 137/89   Pulse 87   Temp (!) 97.2 F (36.2 C) (Oral)   Resp 20   Ht 5\' 9"  (1.753 m)   Wt 113.4 kg   SpO2 94%   BMI 36.92 kg/m  Physical Exam Vitals and nursing note reviewed.   Gen: NAD Eyes: PERRL, EOMI HEENT: no oropharyngeal swelling Neck: trachea midline Resp: clear to auscultation bilaterally Card: RRR, no murmurs, rubs, or gallops Abd: nontender, nondistended Extremities: no calf tenderness, no edema Vascular: 2+ radial pulses bilaterally, 2+  DP pulses bilaterally Neuro: NIH stroke scale of 1 for drift in the left lower extremity Skin: no rashes Psyc: acting appropriately   ED Results / Procedures / Treatments   Labs (all labs ordered are listed, but only abnormal results are displayed) Labs Reviewed  CBC WITH DIFFERENTIAL/PLATELET - Abnormal; Notable for the following components:      Result Value   Hemoglobin 17.4 (*)    All other components within normal limits  COMPREHENSIVE METABOLIC PANEL - Abnormal; Notable for the following components:   Glucose, Bld 119 (*)    Alkaline Phosphatase 31 (*)    All other components within normal limits  CBG MONITORING, ED - Abnormal; Notable for the following components:   Glucose-Capillary 124 (*)    All other components within normal limits  URINALYSIS, ROUTINE W REFLEX MICROSCOPIC  CBG MONITORING, ED    EKG EKG Interpretation Date/Time:  Monday May 28 2023 16:25:50 EST Ventricular Rate:  98 PR Interval:  166 QRS Duration:  94 QT Interval:  338 QTC Calculation: 431 R Axis:   64  Text Interpretation: Normal sinus rhythm Nonspecific T wave abnormality Abnormal ECG When compared with ECG of 15-May-2018 14:57, T wave inversion now evident in Inferior leads Nonspecific T wave abnormality, worse in Anterior leads Confirmed by Beckey Downing [phone]) on 05/28/2023 4:34:22 PM  Radiology DG Hip Unilat W or Wo Pelvis 2-3  Views Left Result Date: 05/28/2023 CLINICAL DATA:  Left hip pain and left-sided weakness EXAM: DG HIP (WITH OR WITHOUT PELVIS) 2-3V LEFT COMPARISON:  None Available. FINDINGS: No acute fracture or dislocation. Degenerative changes pubic symphysis, both hips, SI joints and lower lumbar spine. IMPRESSION: No acute fracture or dislocation. Electronically Signed   By: Minerva Fester M.D.   On: 05/28/2023 20:40   MR BRAIN WO CONTRAST Result Date: 05/28/2023 CLINICAL DATA:  Neuro deficit, concern for stroke, intermittent left-sided weakness and left-sided facial droop. EXAM:  MRI HEAD WITHOUT CONTRAST MRA HEAD WITHOUT CONTRAST TECHNIQUE: Multiplanar, multi-echo pulse sequences of the brain and surrounding structures were acquired without intravenous contrast. Angiographic images of the Circle of Willis were acquired using MRA technique without intravenous contrast. COMPARISON:  None Available. FINDINGS: MRI HEAD FINDINGS Brain: There is an 8 mm focus of restricted diffusion involving the posterior limb of the right internal capsule. No evidence of intracranial hemorrhage. Nonspecific scattered foci of FLAIR hyperintensity in the periventricular and subcortical white matter favored to reflect mild chronic microvascular ischemic changes. No mass lesion or midline shift. Cerebellum is unremarkable. Normal appearance of midline structures. The basilar cisterns are patent. No extra-axial fluid collections. Ventricles: Normal size and configuration of the ventricles. Vascular: Skull base flow voids are visualized. Skull and upper cervical spine: No focal abnormality. Sinuses/Orbits: Mild focal mucosal thickening in the right ethmoid sinus. Mucous retention cyst in the right maxillary sinus. Other: Mastoid air cells are clear. MRA HEAD FINDINGS Anterior circulation: The intracranial ICAs are patent bilaterally. There is no evidence of high-grade stenosis, aneurysm, or vascular malformation. The M1 segments and MCA bifurcations are patent bilaterally. The distal MCA branches are patent. A1 and A2 segments of the ACAs are patent bilaterally. Distal ACA branches are patent. Posterior circulation: The left vertebral artery is dominant. The right vertebral artery appears to terminate at the origin of the right PICA. Basilar artery is patent. The bilateral PCAs are patent. There is fetal origin of the right PCA. Posterior communicating arteries visualized bilaterally, larger on the right. Superior cerebellar arteries visualized bilaterally. PICA is visualized bilaterally. Anatomic variants: Fetal  origin of the right PCA. IMPRESSION: MRI HEAD: 1. 8 mm acute infarct involving the posterior limb of the right internal capsule. 2. Mild chronic microvascular ischemic changes. MRA HEAD: 1. No large vessel occlusion, high-grade stenosis, or aneurysm. Electronically Signed   By: Emily Filbert M.D.   On: 05/28/2023 19:54   MR ANGIO HEAD WO CONTRAST Result Date: 05/28/2023 CLINICAL DATA:  Neuro deficit, concern for stroke, intermittent left-sided weakness and left-sided facial droop. EXAM: MRI HEAD WITHOUT CONTRAST MRA HEAD WITHOUT CONTRAST TECHNIQUE: Multiplanar, multi-echo pulse sequences of the brain and surrounding structures were acquired without intravenous contrast. Angiographic images of the Circle of Willis were acquired using MRA technique without intravenous contrast. COMPARISON:  None Available. FINDINGS: MRI HEAD FINDINGS Brain: There is an 8 mm focus of restricted diffusion involving the posterior limb of the right internal capsule. No evidence of intracranial hemorrhage. Nonspecific scattered foci of FLAIR hyperintensity in the periventricular and subcortical white matter favored to reflect mild chronic microvascular ischemic changes. No mass lesion or midline shift. Cerebellum is unremarkable. Normal appearance of midline structures. The basilar cisterns are patent. No extra-axial fluid collections. Ventricles: Normal size and configuration of the ventricles. Vascular: Skull base flow voids are visualized. Skull and upper cervical spine: No focal abnormality. Sinuses/Orbits: Mild focal mucosal thickening in the right ethmoid sinus. Mucous retention cyst in the right maxillary  sinus. Other: Mastoid air cells are clear. MRA HEAD FINDINGS Anterior circulation: The intracranial ICAs are patent bilaterally. There is no evidence of high-grade stenosis, aneurysm, or vascular malformation. The M1 segments and MCA bifurcations are patent bilaterally. The distal MCA branches are patent. A1 and A2 segments of  the ACAs are patent bilaterally. Distal ACA branches are patent. Posterior circulation: The left vertebral artery is dominant. The right vertebral artery appears to terminate at the origin of the right PICA. Basilar artery is patent. The bilateral PCAs are patent. There is fetal origin of the right PCA. Posterior communicating arteries visualized bilaterally, larger on the right. Superior cerebellar arteries visualized bilaterally. PICA is visualized bilaterally. Anatomic variants: Fetal origin of the right PCA. IMPRESSION: MRI HEAD: 1. 8 mm acute infarct involving the posterior limb of the right internal capsule. 2. Mild chronic microvascular ischemic changes. MRA HEAD: 1. No large vessel occlusion, high-grade stenosis, or aneurysm. Electronically Signed   By: Emily Filbert M.D.   On: 05/28/2023 19:54    Procedures Procedures    Medications Ordered in ED Medications  LORazepam (ATIVAN) tablet 1 mg (has no administration in time range)    ED Course/ Medical Decision Making/ A&P                                 Medical Decision Making 73 year old male with past medical history of hypertension hyperlipidemia presents the emergency department today with left-sided weakness.  I will further evaluate the patient here with basic labs here as well as an MRI to evaluate for stroke.  The patient denies any headache and with the symptoms staying relatively stable suspicion for intracranial hemorrhage is low at this time.  I will hold off on CT imaging at this time.  Is clearly outside the window for acute neurointervention.  The patient's labs are unremarkable.  MRI does show a new stroke.  Calls placed to hospital service for admission.  Risk Decision regarding hospitalization.           Final Clinical Impression(s) / ED Diagnoses Final diagnoses:  Cerebrovascular accident (CVA), unspecified mechanism (HCC)    Rx / DC Orders ED Discharge Orders     None         Durwin Glaze,  MD 05/28/23 2047

## 2023-05-28 NOTE — ED Triage Notes (Signed)
 Pt arrived via POV from Urgent Care for further work-up regarding a possible stroke. Pt reports left side weakness and presents with left side facial droop.

## 2023-05-28 NOTE — ED Notes (Signed)
 Provider at bedside

## 2023-05-28 NOTE — ED Provider Triage Note (Signed)
 Emergency Medicine Provider Triage Evaluation Note  Joseph Peterson , a 73 y.o. male  was evaluated in triage.  Pt complains of intermittent left sided weakness, left facial droop since 05/26/23. Currently asymptomatic. Patient complains of left hip pain for "a while". No surgical hx, trauma nor has seen a provider for this.  Today, they sought UC evaluation d/t wife noticing him having difficulty with getting up from seat. UC recommended ED eval for stroke r/o  He has been ambulating with a cane for past two days d/t feeling unsteady and off balance.  Wife at bedside reports noticing left-sided facial droop this morning but denies confusion, AMS.  He denies HA, trauma, visual disturbances, thinners  Review of Systems  Positive: left sided weakness, left facial droop, left hip pain Negative: fevers  Physical Exam  BP (!) 156/98 (BP Location: Right Arm)   Pulse (!) 101   Temp 98.8 F (37.1 C) (Oral)   Resp 18   Ht 5\' 9"  (1.753 m)   Wt 113.4 kg   SpO2 97%   BMI 36.92 kg/m  Gen:   Awake, no distress   Resp:  Normal effort  MSK:   Moves extremities without difficulty  Other:  Equal grip strength, vision grossly intact, no facial droop, sensation of BUE and BLE intact, no slurred speech. Ambulates without difficulty nor cane without weakness.  Medical Decision Making  Medically screening exam initiated at 4:29 PM.  Appropriate orders placed.  Joseph Peterson was informed that the remainder of the evaluation will be completed by another provider, this initial triage assessment does not replace that evaluation, and the importance of remaining in the ED until their evaluation is complete.  Labs and MRI ordered   Judithann Sheen, [place] 05/28/23 [phone]

## 2023-05-28 NOTE — H&P (Signed)
 History and Physical    Joseph Peterson GNF:[id number] DOB: 01-01-1951 DOA: 05/28/2023  PCP: Carylon Perches, MD   Patient coming from: Home   Chief Complaint: left-sided weakness   HPI: Joseph Peterson is a pleasant 73 y.o. male with medical history significant for hypertension, hyperlipidemia, sleep apnea on CPAP, and COPD who presents with left-sided weakness.  Patient went to bed in his usual state of health the night of 05/25/2023 but was having difficulty ambulating when he woke the following morning.  He realized that his left leg was weak and also noted some left arm weakness.  His wife noticed a left facial droop.  Symptoms persist but have been slowly improving.  He denies any recent chest pain, palpitations, fever, or chills.  ED Course: Upon arrival to the ED, patient is found to be afebrile and saturating mid 90s on room air with elevated blood pressure.  EKG demonstrates sinus rhythm.  Labs are most notable for normal creatinine, normal WBC, and negative urinalysis.  MRI brain reveals acute infarct involving the posterior limb of the right internal capsule.  MRA head is negative.  Review of Systems:  All other systems reviewed and apart from HPI, are negative.  Past Medical History:  Diagnosis Date   COPD (chronic obstructive pulmonary disease) (HCC)    Hypercholesteremia    Hypertension    Sleep apnea     Past Surgical History:  Procedure Laterality Date   COLONOSCOPY N/A 05/26/2013   Procedure: COLONOSCOPY;  Surgeon: Corbin Ade, MD;  Location: [facility];  Service: Endoscopy;  Laterality: N/A;  1:00 PM-moved to [address] notified pt   COLONOSCOPY WITH PROPOFOL N/A 04/08/2020   Procedure: COLONOSCOPY WITH PROPOFOL;  Surgeon: Corbin Ade, MD;  Location: [facility];  Service: Endoscopy;  Laterality: N/A;  10:30am   POLYPECTOMY  04/08/2020   Procedure: POLYPECTOMY;  Surgeon: Corbin Ade, MD;  Location: [facility];  Service: Endoscopy;;   TOTAL KNEE  ARTHROPLASTY Right 05/21/2018   Procedure: TOTAL KNEE ARTHROPLASTY-RIGHT;  Surgeon: Kennedy Bucker, MD;  Location: [facility];  Service: Orthopedics;  Laterality: Right;    Social History:   reports that he has quit smoking. He has quit using smokeless tobacco. He reports current alcohol use. He reports that he does not use drugs.  No Known Allergies  Family History  Problem Relation Age of Onset   Colon cancer Neg Hx      Prior to Admission medications   Medication Sig Start Date End Date Taking? Authorizing Provider  amLODipine (NORVASC) 10 MG tablet Take 10 mg by mouth daily.   Yes [provider]  fenofibrate 160 MG tablet Take 160 mg by mouth daily.   Yes [provider]  hydrochlorothiazide (HYDRODIURIL) 25 MG tablet Take 25 mg by mouth daily.   Yes [provider]    Physical Exam: Vitals:   05/28/23 1609 05/28/23 1610 05/28/23 1830 05/28/23 1930  BP: (!) 156/98  (!) 154/94 137/89  Pulse: (!) 101  91 87  Resp: 18  (!) 22 20  Temp: 98.8 F (37.1 C)  98 F (36.7 C) (!) 97.2 F (36.2 C)  TempSrc: Oral  Oral Oral  SpO2: 97%  94% 94%  Weight:  113.4 kg    Height:  5\' 9"  (1.753 m)      Constitutional: NAD, calm  Eyes: PERTLA, lids and conjunctivae normal ENMT: Mucous membranes are moist. Posterior pharynx clear of any exudate or lesions.  Neck: supple, no masses  Respiratory: no wheezing, no crackles. No accessory muscle use.  Cardiovascular: S1 & S2 heard, regular rate and rhythm. No extremity edema.   Abdomen: No distension, no tenderness, soft. Bowel sounds active.  Musculoskeletal: no clubbing / cyanosis. No joint deformity upper and lower extremities.   Skin: no significant rashes, lesions, ulcers. Warm, dry, well-perfused. Neurologic: Mild left lower facial weakness, CN 2-12 grossly intact otherwise. Sensation to light touch intact. DTR normal. Strength 4/5 in LUE, otherwise 5/5 in extremities. Alert and oriented.  Psychiatric: Pleasant.  Cooperative.    Labs and Imaging on Admission: I have personally reviewed following labs and imaging studies  CBC: Recent Labs  Lab 05/28/23 1820  WBC 8.2  NEUTROABS 5.7  HGB 17.4*  HCT 50.8  MCV 89.1  PLT 272   Basic Metabolic Panel: Recent Labs  Lab 05/28/23 1820  NA 138  K 3.6  CL 100  CO2 25  GLUCOSE 119*  BUN 14  CREATININE 1.04  CALCIUM 9.9   GFR: Estimated Creatinine Clearance: 77.4 mL/min (by C-G formula based on SCr of 1.04 mg/dL). Liver Function Tests: Recent Labs  Lab 05/28/23 1820  AST 32  ALT 37  ALKPHOS 31*  BILITOT 1.0  PROT 8.0  ALBUMIN 4.3   No results for input(s): "LIPASE", "AMYLASE" in the last 168 hours. No results for input(s): "AMMONIA" in the last 168 hours. Coagulation Profile: No results for input(s): "INR", "PROTIME" in the last 168 hours. Cardiac Enzymes: No results for input(s): "CKTOTAL", "CKMB", "CKMBINDEX", "TROPONINI" in the last 168 hours. BNP (last 3 results) No results for input(s): "PROBNP" in the last 8760 hours. HbA1C: No results for input(s): "HGBA1C" in the last 72 hours. CBG: Recent Labs  Lab 05/28/23 1820  GLUCAP 124*   Lipid Profile: No results for input(s): "CHOL", "HDL", "LDLCALC", "TRIG", "CHOLHDL", "LDLDIRECT" in the last 72 hours. Thyroid Function Tests: No results for input(s): "TSH", "T4TOTAL", "FREET4", "T3FREE", "THYROIDAB" in the last 72 hours. Anemia Panel: No results for input(s): "VITAMINB12", "FOLATE", "FERRITIN", "TIBC", "IRON", "RETICCTPCT" in the last 72 hours. Urine analysis:    Component Value Date/Time   COLORURINE YELLOW 05/28/2023 1825   APPEARANCEUR CLEAR 05/28/2023 1825   LABSPEC 1.010 05/28/2023 1825   PHURINE 6.0 05/28/2023 1825   GLUCOSEU NEGATIVE 05/28/2023 1825   HGBUR NEGATIVE 05/28/2023 1825   BILIRUBINUR NEGATIVE 05/28/2023 1825   KETONESUR NEGATIVE 05/28/2023 1825   PROTEINUR NEGATIVE 05/28/2023 1825   NITRITE NEGATIVE 05/28/2023 1825   LEUKOCYTESUR NEGATIVE  05/28/2023 1825   Sepsis Labs: @LABRCNTIP (procalcitonin:4,lacticidven:4) )No results found for this or any previous visit (from the past 240 hours).   Radiological Exams on Admission: DG Hip Unilat W or Wo Pelvis 2-3 Views Left Result Date: 05/28/2023 CLINICAL DATA:  Left hip pain and left-sided weakness EXAM: DG HIP (WITH OR WITHOUT PELVIS) 2-3V LEFT COMPARISON:  None Available. FINDINGS: No acute fracture or dislocation. Degenerative changes pubic symphysis, both hips, SI joints and lower lumbar spine. IMPRESSION: No acute fracture or dislocation. Electronically Signed   By: Minerva Fester M.D.   On: 05/28/2023 20:40   MR BRAIN WO CONTRAST Result Date: 05/28/2023 CLINICAL DATA:  Neuro deficit, concern for stroke, intermittent left-sided weakness and left-sided facial droop. EXAM: MRI HEAD WITHOUT CONTRAST MRA HEAD WITHOUT CONTRAST TECHNIQUE: Multiplanar, multi-echo pulse sequences of the brain and surrounding structures were acquired without intravenous contrast. Angiographic images of the Circle of Willis were acquired using MRA technique without intravenous contrast. COMPARISON:  None Available. FINDINGS: MRI  HEAD FINDINGS Brain: There is an 8 mm focus of restricted diffusion involving the posterior limb of the right internal capsule. No evidence of intracranial hemorrhage. Nonspecific scattered foci of FLAIR hyperintensity in the periventricular and subcortical white matter favored to reflect mild chronic microvascular ischemic changes. No mass lesion or midline shift. Cerebellum is unremarkable. Normal appearance of midline structures. The basilar cisterns are patent. No extra-axial fluid collections. Ventricles: Normal size and configuration of the ventricles. Vascular: Skull base flow voids are visualized. Skull and upper cervical spine: No focal abnormality. Sinuses/Orbits: Mild focal mucosal thickening in the right ethmoid sinus. Mucous retention cyst in the right maxillary sinus. Other: Mastoid  air cells are clear. MRA HEAD FINDINGS Anterior circulation: The intracranial ICAs are patent bilaterally. There is no evidence of high-grade stenosis, aneurysm, or vascular malformation. The M1 segments and MCA bifurcations are patent bilaterally. The distal MCA branches are patent. A1 and A2 segments of the ACAs are patent bilaterally. Distal ACA branches are patent. Posterior circulation: The left vertebral artery is dominant. The right vertebral artery appears to terminate at the origin of the right PICA. Basilar artery is patent. The bilateral PCAs are patent. There is fetal origin of the right PCA. Posterior communicating arteries visualized bilaterally, larger on the right. Superior cerebellar arteries visualized bilaterally. PICA is visualized bilaterally. Anatomic variants: Fetal origin of the right PCA. IMPRESSION: MRI HEAD: 1. 8 mm acute infarct involving the posterior limb of the right internal capsule. 2. Mild chronic microvascular ischemic changes. MRA HEAD: 1. No large vessel occlusion, high-grade stenosis, or aneurysm. Electronically Signed   By: Emily Filbert M.D.   On: 05/28/2023 19:54   MR ANGIO HEAD WO CONTRAST Result Date: 05/28/2023 CLINICAL DATA:  Neuro deficit, concern for stroke, intermittent left-sided weakness and left-sided facial droop. EXAM: MRI HEAD WITHOUT CONTRAST MRA HEAD WITHOUT CONTRAST TECHNIQUE: Multiplanar, multi-echo pulse sequences of the brain and surrounding structures were acquired without intravenous contrast. Angiographic images of the Circle of Willis were acquired using MRA technique without intravenous contrast. COMPARISON:  None Available. FINDINGS: MRI HEAD FINDINGS Brain: There is an 8 mm focus of restricted diffusion involving the posterior limb of the right internal capsule. No evidence of intracranial hemorrhage. Nonspecific scattered foci of FLAIR hyperintensity in the periventricular and subcortical white matter favored to reflect mild chronic microvascular  ischemic changes. No mass lesion or midline shift. Cerebellum is unremarkable. Normal appearance of midline structures. The basilar cisterns are patent. No extra-axial fluid collections. Ventricles: Normal size and configuration of the ventricles. Vascular: Skull base flow voids are visualized. Skull and upper cervical spine: No focal abnormality. Sinuses/Orbits: Mild focal mucosal thickening in the right ethmoid sinus. Mucous retention cyst in the right maxillary sinus. Other: Mastoid air cells are clear. MRA HEAD FINDINGS Anterior circulation: The intracranial ICAs are patent bilaterally. There is no evidence of high-grade stenosis, aneurysm, or vascular malformation. The M1 segments and MCA bifurcations are patent bilaterally. The distal MCA branches are patent. A1 and A2 segments of the ACAs are patent bilaterally. Distal ACA branches are patent. Posterior circulation: The left vertebral artery is dominant. The right vertebral artery appears to terminate at the origin of the right PICA. Basilar artery is patent. The bilateral PCAs are patent. There is fetal origin of the right PCA. Posterior communicating arteries visualized bilaterally, larger on the right. Superior cerebellar arteries visualized bilaterally. PICA is visualized bilaterally. Anatomic variants: Fetal origin of the right PCA. IMPRESSION: MRI HEAD: 1. 8 mm acute infarct involving the posterior  limb of the right internal capsule. 2. Mild chronic microvascular ischemic changes. MRA HEAD: 1. No large vessel occlusion, high-grade stenosis, or aneurysm. Electronically Signed   By: Emily Filbert M.D.   On: 05/28/2023 19:54    EKG: Independently reviewed. Sinus rhythm.   Assessment/Plan   1. Ischemic CVA   - Continue cardiac monitoring and frequent neuro checks, keep NPO pending swallow screen, check lipids, A1c, echo, and carotid US, consult PT/OT/SLP, start ASA and Plavix, consult neurology (routine consultation requested via Epic order)      DVT prophylaxis: Lovenox  Code Status: Full  Level of Care: Level of care: Telemetry Family Communication: Wife at bedside   Disposition Plan:  Patient is from: home  Anticipated d/c is to: TBD Anticipated d/c date is: 3/4 or 05/30/23  Patient currently: Pending CVA workup  Consults called: Routine neurology consultation requested via Epic order  Admission status: observation     Briscoe Deutscher, MD [facility]  05/28/2023, 9:20 PM

## 2023-05-28 NOTE — ED Triage Notes (Signed)
 Spoke with provider about pts symptoms of left sided weakness x 2 days, and per her recommendation it would be beneficial for pt to go to the hospital for further testing to rule out a stroke. Pt agreed and wanted to get further testing done. Pt states they will go to [facility].

## 2023-05-28 NOTE — ED Notes (Signed)
 Patient is being discharged from the Urgent Care and sent to the Emergency Department via PMV, family member driving . Per provider Faye Ramsay., patient is in need of higher level of care due left sided weakness and needing further testing. Patient is aware and verbalizes understanding of plan of care.  Vitals:   05/28/23 1450  BP: 121/81  Pulse: (!) 108  Resp: 18  Temp: 97.9 F (36.6 C)  SpO2: 92%

## 2023-05-29 ENCOUNTER — Telehealth: Payer: Self-pay | Admitting: *Deleted

## 2023-05-29 ENCOUNTER — Observation Stay (HOSPITAL_COMMUNITY)

## 2023-05-29 ENCOUNTER — Encounter (HOSPITAL_COMMUNITY): Payer: Self-pay | Admitting: Family Medicine

## 2023-05-29 ENCOUNTER — Observation Stay

## 2023-05-29 ENCOUNTER — Other Ambulatory Visit (HOSPITAL_COMMUNITY): Payer: Self-pay | Admitting: *Deleted

## 2023-05-29 DIAGNOSIS — I6389 Other cerebral infarction: Secondary | ICD-10-CM

## 2023-05-29 DIAGNOSIS — I10 Essential (primary) hypertension: Secondary | ICD-10-CM

## 2023-05-29 DIAGNOSIS — E66812 Obesity, class 2: Secondary | ICD-10-CM | POA: Insufficient documentation

## 2023-05-29 DIAGNOSIS — J449 Chronic obstructive pulmonary disease, unspecified: Secondary | ICD-10-CM | POA: Diagnosis not present

## 2023-05-29 DIAGNOSIS — I639 Cerebral infarction, unspecified: Secondary | ICD-10-CM | POA: Diagnosis not present

## 2023-05-29 DIAGNOSIS — G4730 Sleep apnea, unspecified: Secondary | ICD-10-CM

## 2023-05-29 DIAGNOSIS — I6523 Occlusion and stenosis of bilateral carotid arteries: Secondary | ICD-10-CM | POA: Diagnosis not present

## 2023-05-29 LAB — BASIC METABOLIC PANEL
Anion gap: 8 (ref 5–15)
BUN: 14 mg/dL (ref 8–23)
CO2: 27 mmol/L (ref 22–32)
Calcium: 9.1 mg/dL (ref 8.9–10.3)
Chloride: 103 mmol/L (ref 98–111)
Creatinine, Ser: 0.89 mg/dL (ref 0.61–1.24)
GFR, Estimated: >60 mL/min (ref >60)
Glucose, Bld: 108 mg/dL — ABNORMAL HIGH (ref 70–99)
Potassium: 3 mmol/L — ABNORMAL LOW (ref 3.5–5.1)
Sodium: 138 mmol/L (ref 135–145)

## 2023-05-29 LAB — LIPID PANEL
Cholesterol: 221 mg/dL — ABNORMAL HIGH (ref 0–200)
HDL: 44 mg/dL (ref >40)
LDL Cholesterol: 149 mg/dL — ABNORMAL HIGH (ref 0–99)
Total CHOL/HDL Ratio: 5 ratio
Triglycerides: 138 mg/dL (ref <150)
VLDL: 28 mg/dL (ref 0–40)

## 2023-05-29 LAB — CBC
HCT: 47.5 % (ref 39.0–52.0)
Hemoglobin: 16.3 g/dL (ref 13.0–17.0)
MCH: 30.6 pg (ref 26.0–34.0)
MCHC: 34.3 g/dL (ref 30.0–36.0)
MCV: 89.3 fL (ref 80.0–100.0)
Platelets: 268 10*3/uL (ref 150–400)
RBC: 5.32 MIL/uL (ref 4.22–5.81)
RDW: 12.7 % (ref 11.5–15.5)
WBC: 7.6 10*3/uL (ref 4.0–10.5)
nRBC: 0 % (ref 0.0–0.2)

## 2023-05-29 LAB — ECHOCARDIOGRAM COMPLETE
AR max vel: 3.59 cm2
AV Area VTI: 3.32 cm2
AV Area mean vel: 3.48 cm2
AV Mean grad: 6 mmHg
AV Peak grad: 10.8 mmHg
Ao pk vel: 1.64 m/s
Area-P 1/2: 2.26 cm2
Height: 69 in
S' Lateral: 2.4 cm
Weight: 3989.44 [oz_av]

## 2023-05-29 LAB — HEMOGLOBIN A1C
Hgb A1c MFr Bld: 6.5 % — ABNORMAL HIGH (ref 4.8–5.6)
Mean Plasma Glucose: 139.85 mg/dL

## 2023-05-29 LAB — MRSA NEXT GEN BY PCR, NASAL: MRSA by PCR Next Gen: NOT DETECTED

## 2023-05-29 MED ORDER — ATORVASTATIN CALCIUM 40 MG PO TABS
40.0000 mg | ORAL_TABLET | Freq: Every day | ORAL | Status: DC
  Administered 2023-05-29 – 2023-05-30 (×2): 40 mg via ORAL
  Filled 2023-05-29 (×2): qty 1

## 2023-05-29 MED ORDER — ORAL CARE MOUTH RINSE: 15.0000 mL | OROMUCOSAL | Status: DC | PRN

## 2023-05-29 MED ORDER — PERFLUTREN LIPID MICROSPHERE
1.0000 mL | INTRAVENOUS | Status: AC | PRN
  Administered 2023-05-29: 4 mL via INTRAVENOUS

## 2023-05-29 NOTE — Subjective & Objective (Signed)
 Pt seen and examined. Went to bed normal on Friday night(05-25-2023). Woke up on Saturday AM(05-26-2023) with some generalized weakness and difficultly walking.  Sat in his recliner that AM and symptoms improved. Able to go out to dinner on Saturday night without issue.  Went to bed on Sunday night feeling normal. Woke up on Monday AM with left hand weakness.  Pt is right hand dominant. No headache. Still with clumsiness of left hand.  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 No prior hx of afib.

## 2023-05-29 NOTE — Evaluation (Signed)
 Physical Therapy Evaluation Patient Details Name: Joseph Peterson MRN: [id number] DOB: Aug 01, 1950 Today's Date: 05/29/2023  History of Present Illness  Joseph Peterson is a pleasant 73 y.o. male with medical history significant for hypertension, hyperlipidemia, sleep apnea on CPAP, and COPD who presents with left-sided weakness.     Patient went to bed in his usual state of health the night of 05/25/2023 but was having difficulty ambulating when he woke the following morning.  He realized that his left leg was weak and also noted some left arm weakness.  His wife noticed a left facial droop.  Symptoms persist but have been slowly improving.  He denies any recent chest pain, palpitations, fever, or chills.   Clinical Impression  Patient functioning near baseline for functional mobility and gait other than presenting with LUE weakness, no significant difference in BLE strength.  Patient able to ambulate in room, hallways without AD, but c/o mild difficulty advancing LLE during gait training without loss of balance. Patient tolerated sitting up in chair after therapy with spouse present. Patient will benefit from continued skilled physical therapy in hospital and recommended venue below to increase strength, balance, endurance for safe ADLs and gait.           If plan is discharge home, recommend the following: A little help with walking and/or transfers;A little help with bathing/dressing/bathroom;Help with stairs or ramp for entrance;Assistance with cooking/housework   Can travel by private vehicle        Equipment Recommendations None recommended by PT  Recommendations for Other Services       Functional Status Assessment Patient has had a recent decline in their functional status and demonstrates the ability to make significant improvements in function in a reasonable and predictable amount of time.     Precautions / Restrictions Precautions Precautions: Fall Recall of  Precautions/Restrictions: Intact Restrictions Weight Bearing Restrictions Per Provider Order: No      Mobility  Bed Mobility Overal bed mobility: Modified Independent Bed Mobility: Supine to Sit           General bed mobility comments: slightly labored movement with HOB flat    Transfers Overall transfer level: Needs assistance Equipment used: None Transfers: Sit to/from Stand, Bed to chair/wheelchair/BSC Sit to Stand: Contact guard assist, Supervision   Step pivot transfers: Contact guard assist       General transfer comment: unsteady with 1 near loss of balance during transfer to chair, able to self correct with CGA    Ambulation/Gait Ambulation/Gait assistance: Supervision Gait Distance (Feet): 100 Feet Assistive device: None Gait Pattern/deviations: Decreased step length - left, Decreased stance time - left, Decreased stride length Gait velocity: slightly decreased     General Gait Details: slightly labored movement with c/o mild difficulty advancing LLE, stating "it feels like I'm dragging left foot", no loss of balance and good return for completing turns to left  Stairs            Wheelchair Mobility     Tilt Bed    Modified Rankin (Stroke Patients Only)       Balance Overall balance assessment: Needs assistance Sitting-balance support: Feet supported, No upper extremity supported Sitting balance-Leahy Scale: Good Sitting balance - Comments: seated at EOB   Standing balance support: During functional activity, No upper extremity supported Standing balance-Leahy Scale: Fair Standing balance comment: fair/good without AD  Pertinent Vitals/Pain Pain Assessment Pain Assessment: No/denies pain    Home Living Family/patient expects to be discharged to:: Private residence Living Arrangements: Spouse/significant other Available Help at Discharge: Family;Available 24 hours/day Type of Home: House Home  Access: Stairs to enter Entrance Stairs-Rails: Can reach both;Right;Left Entrance Stairs-Number of Steps: 3   Home Layout: One level Home Equipment: Agricultural consultant (2 wheels);Cane - single point;Cane - quad;BSC/3in1      Prior Function Prior Level of Function : Independent/Modified Independent             Mobility Comments: [place] and some community ambulation without AD. ADLs Comments: Independent     Extremity/Trunk Assessment   Upper Extremity Assessment Upper Extremity Assessment: Defer to OT evaluation LUE Deficits / Details: 5/5 other than elbow flexion at 4+/5 and cmoposite grip at 4+/5. Mild decreased FM and gross motor coordination. LUE Sensation: WNL LUE Coordination: decreased fine motor;decreased gross motor    Lower Extremity Assessment Lower Extremity Assessment: Overall WFL for tasks assessed    Cervical / Trunk Assessment Cervical / Trunk Assessment: Normal  Communication   Communication Communication: No apparent difficulties    Cognition Arousal: Alert Behavior During Therapy: WFL for tasks assessed/performed                             Following commands: Intact       Cueing Cueing Techniques: Verbal cues     General Comments      Exercises     Assessment/Plan    PT Assessment Patient needs continued PT services  PT Problem List Decreased strength;Decreased activity tolerance;Decreased balance;Decreased mobility       PT Treatment Interventions DME instruction;Gait training;Stair training;Functional mobility training;Therapeutic activities;Therapeutic exercise;Balance training;Patient/family education    PT Goals (Current goals can be found in the Care Plan section)  Acute Rehab PT Goals Patient Stated Goal: return home with family to assist PT Goal Formulation: With patient/family Time For Goal Achievement: 05/31/23 Potential to Achieve Goals: Good    Frequency Min 3X/week     Co-evaluation PT/OT/SLP  Co-Evaluation/Treatment: Yes Reason for Co-Treatment: To address functional/ADL transfers PT goals addressed during session: Mobility/safety with mobility;Balance OT goals addressed during session: ADL's and self-care       AM-PAC PT "6 Clicks" Mobility  Outcome Measure Help needed turning from your back to your side while in a flat bed without using bedrails?: None Help needed moving from lying on your back to sitting on the side of a flat bed without using bedrails?: A Little Help needed moving to and from a bed to a chair (including a wheelchair)?: A Little Help needed standing up from a chair using your arms (e.g., wheelchair or bedside chair)?: None Help needed to walk in hospital room?: A Little Help needed climbing 3-5 steps with a railing? : A Little 6 Click Score: 20    End of Session Equipment Utilized During Treatment: Gait belt Activity Tolerance: Patient tolerated treatment well Patient left: in chair;with call bell/phone within reach;with family/visitor present Nurse Communication: Mobility status PT Visit Diagnosis: Unsteadiness on feet (R26.81);Other abnormalities of gait and mobility (R26.89);Muscle weakness (generalized) (M62.81)    Time: 1610-9604 PT Time Calculation (min) (ACUTE ONLY): 22 min   Charges:   PT Evaluation $PT Eval Moderate Complexity: 1 Mod PT Treatments $Therapeutic Activity: 8-22 mins PT General Charges $$ ACUTE PT VISIT: 1 Visit         11:39 AM, 05/29/23 Ocie Bob, MPT Physical  Therapist with [facility] 336 [phone] office [phone] mobile phone

## 2023-05-29 NOTE — Hospital Course (Signed)
 HPI: Joseph Peterson is a pleasant 73 y.o. male with medical history significant for hypertension, hyperlipidemia, sleep apnea on CPAP, and COPD who presents with left-sided weakness.   Patient went to bed in his usual state of health the night of 05/25/2023 but was having difficulty ambulating when he woke the following morning.  He realized that his left leg was weak and also noted some left arm weakness.  His wife noticed a left facial droop.  Symptoms persist but have been slowly improving.  He denies any recent chest pain, palpitations, fever, or chills.   ED Course: Upon arrival to the ED, patient is found to be afebrile and saturating mid 90s on room air with elevated blood pressure.  EKG demonstrates sinus rhythm.  Labs are most notable for normal creatinine, normal WBC, and negative urinalysis.  MRI brain reveals acute infarct involving the posterior limb of the right internal capsule.  MRA head is negative.  Significant Events: Admitted 05/28/2023 for acute CVA posterior limb of right internal capsule   Significant Labs: WBC 8.2, HgB 17.4, Plt 272 Na 138, K 3.6, CO2 of 25, BUN 14, scr 1.04, glu 119 TC 221, HDL 44, LDL 149, TG 138  Significant Imaging Studies: Hip XR No acute fracture or dislocation  MRI brain/MRA Brain 8 mm acute infarct involving the posterior limb of the right internal capsule. 2. Mild chronic microvascular ischemic changes. 3. No large vessel occlusion, high-grade stenosis, or aneurysm.   Antibiotic Therapy: Anti-infectives (From admission, onward)    None       Procedures:   Consultants:

## 2023-05-29 NOTE — Plan of Care (Signed)
  Problem: Acute Rehab PT Goals(only PT should resolve) Goal: Pt Will Go Supine/Side To Sit Outcome: Progressing Flowsheets (Taken 05/29/2023 1140) Pt will go Supine/Side to Sit: with modified independence Goal: Patient Will Transfer Sit To/From Stand Outcome: Progressing Flowsheets (Taken 05/29/2023 1140) Patient will transfer sit to/from stand: with modified independence Goal: Pt Will Transfer Bed To Chair/Chair To Bed Outcome: Progressing Flowsheets (Taken 05/29/2023 1140) Pt will Transfer Bed to Chair/Chair to Bed: with modified independence Goal: Pt Will Ambulate Outcome: Progressing Flowsheets (Taken 05/29/2023 1140) Pt will Ambulate:  > 125 feet  with modified independence  Independently  with least restrictive assistive device   11:41 AM, 05/29/23 Ocie Bob, MPT Physical Therapist with [facility] [phone] office [phone] mobile phone

## 2023-05-29 NOTE — Telephone Encounter (Signed)
-----   Message from Ellsworth Lennox sent at 05/29/2023  8:47 AM EST ----- Regarding: 14-day Zio This patient needs a 14-day non-live Zio patch for CVA per Dr. Imogene Burn.   Thanks,  [place]

## 2023-05-29 NOTE — Evaluation (Signed)
 Occupational Therapy Evaluation Patient Details Name: Joseph Peterson MRN: [id number] DOB: 02-Nov-1950 Today's Date: 05/29/2023   History of Present Illness   Joseph Peterson is a pleasant 73 y.o. male with medical history significant for hypertension, hyperlipidemia, sleep apnea on CPAP, and COPD who presents with left-sided weakness.     Patient went to bed in his usual state of health the night of 05/25/2023 but was having difficulty ambulating when he woke the following morning.  He realized that his left leg was weak and also noted some left arm weakness.  His wife noticed a left facial droop.  Symptoms persist but have been slowly improving.  He denies any recent chest pain, palpitations, fever, or chills. (per MD)     Clinical Impressions Pt agreeable OT and PT co-evaluation. Pt reports some difficulty with L UE coordination and dexterity. Able to complete bed mobility independently. CGA for step pivot and ambulation due to mild loss of balance for transfer to chair. Improved with increased time ambulating, but pt reporting feeling as though he is dragging his L LE some. Supervision to CGA needed when standing for safety. Good ability to compete seated ADL's. Pt left in the chair with call bell within reach and family present. Pt is not recommended for further acute OT services and will be discharged to care of nursing staff for remaining length of stay.      If plan is discharge home, recommend the following:   A little help with walking and/or transfers;A little help with bathing/dressing/bathroom;Help with stairs or ramp for entrance;Assist for transportation     Functional Status Assessment   Patient has had a recent decline in their functional status and demonstrates the ability to make significant improvements in function in a reasonable and predictable amount of time.     Equipment Recommendations   None recommended by OT             Precautions/Restrictions    Precautions Precautions: Fall Recall of Precautions/Restrictions: Intact Restrictions Weight Bearing Restrictions Per Provider Order: No     Mobility Bed Mobility Overal bed mobility: Needs Assistance Bed Mobility: Supine to Sit     Supine to sit: Independent          Transfers Overall transfer level: Needs assistance   Transfers: Sit to/from Stand, Bed to chair/wheelchair/BSC Sit to Stand: Contact guard assist, Supervision     Step pivot transfers: Contact guard assist     General transfer comment: mild loss of balance to L side during step pivot to chair without AD. Improved balance during ambulation.      Balance Overall balance assessment: Needs assistance Sitting-balance support: No upper extremity supported, Feet supported Sitting balance-Leahy Scale: Good Sitting balance - Comments: seated at EOB   Standing balance support: No upper extremity supported, During functional activity Standing balance-Leahy Scale: Fair Standing balance comment: without AD                           ADL either performed or assessed with clinical judgement   ADL Overall ADL's : Needs assistance/impaired     Grooming: Modified independent;Supervision/safety;Standing   Upper Body Bathing: Independent;Sitting   Lower Body Bathing: Supervison/ safety;Sit to/from stand;Sitting/lateral leans   Upper Body Dressing : Independent;Sitting   Lower Body Dressing: Supervision/safety;Sitting/lateral leans;Sit to/from stand   Toilet Transfer: Supervision/safety;Contact guard assist;Stand-pivot Statistician Details (indicate cue type and reason): Mildly unsteady with small loss of balance for EOB to chair  transfer simulate a toilet transfer. Toileting- Clothing Manipulation and Hygiene: Supervision/safety       Functional mobility during ADLs: Supervision/safety;Contact guard assist General ADL Comments: Able to ambualte over 100 feet in the hall without AD.      Vision Baseline Vision/History: 1 Wears glasses Ability to See in Adequate Light: 1 Impaired Patient Visual Report: No change from baseline Vision Assessment?: No apparent visual deficits     Perception Perception: Not tested       Praxis Praxis: Not tested       Pertinent Vitals/Pain Pain Assessment Pain Assessment: No/denies pain     Extremity/Trunk Assessment Upper Extremity Assessment Upper Extremity Assessment: Right hand dominant;LUE deficits/detail LUE Deficits / Details: 5/5 other than elbow flexion at 4+/5 and cmoposite grip at 4+/5. Mild decreased FM and gross motor coordination. LUE Sensation: WNL LUE Coordination: decreased fine motor;decreased gross motor   Lower Extremity Assessment Lower Extremity Assessment: Defer to PT evaluation   Cervical / Trunk Assessment Cervical / Trunk Assessment: Normal   Communication Communication Communication: No apparent difficulties   Cognition Arousal: Alert Behavior During Therapy: WFL for tasks assessed/performed Cognition: No apparent impairments                               Following commands: Intact       Cueing  General Comments   Cueing Techniques: Verbal cues                 Home Living Family/patient expects to be discharged to:: Private residence Living Arrangements: Spouse/significant other Available Help at Discharge: Family;Available 24 hours/day Type of Home: House Home Access: Stairs to enter [employer] of Steps: 3 Entrance Stairs-Rails: Can reach both;Right;Left Home Layout: One level     Bathroom Shower/Tub: Chief Strategy Officer: Standard Bathroom Accessibility: No   Home Equipment: Agricultural consultant (2 wheels);Cane - single point;Cane - quad;BSC/3in1          Prior Functioning/Environment Prior Level of Function : Independent/Modified Independent             Mobility Comments: [place] and some community ambulation without  AD. ADLs Comments: Independent                            Co-evaluation PT/OT/SLP Co-Evaluation/Treatment: Yes Reason for Co-Treatment: To address functional/ADL transfers   OT goals addressed during session: ADL's and self-care      AM-PAC OT "6 Clicks" Daily Activity     Outcome Measure Help from another person eating meals?: None Help from another person taking care of personal grooming?: A Little Help from another person toileting, which includes using toliet, bedpan, or urinal?: A Little Help from another person bathing (including washing, rinsing, drying)?: A Little Help from another person to put on and taking off regular upper body clothing?: None Help from another person to put on and taking off regular lower body clothing?: A Little 6 Click Score: 20   End of Session Equipment Utilized During Treatment: Gait belt  Activity Tolerance: Patient tolerated treatment well Patient left: in chair;with call bell/phone within reach;with family/visitor present  OT Visit Diagnosis: Unsteadiness on feet (R26.81);Other abnormalities of gait and mobility (R26.89);Muscle weakness (generalized) (M62.81);Other symptoms and signs involving the nervous system (Y78.295)                Time: 6213-0865 OT Time Calculation (min): 17 min Charges:  OT General Charges $OT Visit: 1 Visit OT Evaluation $OT Eval Low Complexity: 1 Low  Ofelia Podolski OT, MOT   Danie Chandler 05/29/2023, 9:42 AM

## 2023-05-29 NOTE — Assessment & Plan Note (Signed)
 05-29-2023 stable.

## 2023-05-29 NOTE — Progress Notes (Signed)
   Met with pt and wife. Echo, carotid dopplers completed but not yet read. Discussed that it may be until tomorrow to find out results of both tests.  Will transfer out of ICU in the interim.  Discussed stroke f/u clinic, ASA and plavix x 3 weeks. Controlling BP, cholesterol, diet and getting more exercise.  Carollee Herter, DO [facility]

## 2023-05-29 NOTE — Care Management Obs Status (Signed)
 MEDICARE OBSERVATION STATUS NOTIFICATION   Patient Details  Name: Joseph Peterson MRN: [id number] Date of Birth: 1950/12/26   Medicare Observation Status Notification Given:  Yes    Corey Harold 05/29/2023, 3:07 PM

## 2023-05-29 NOTE — TOC Transition Note (Signed)
 Transition of Care [facility]) - Discharge Note   Patient Details  Name: Joseph Peterson MRN: [id number] Date of Birth: 1950-06-17  [facility] CM/SW Contact:  Isabella Bowens, LCSWA Phone Number: 05/29/2023, 1:39 PM   Clinical Narrative:    CSW spoke with patient wife . Wife shared that it is just her and patient in the home. Wife shared that pt can drive but she does most of the driving . Wife shared that pt does not have any equipment at home and is independent. CSW shared with spouse, PT recommendation for OPPT; wife is in agreement with CSW placing order for [facility] outpatient rehab in [place]. MD stated that pt should be ready to DC today after testing and PT evaluation, spouse made aware TOC signing off.   Final next level of care: OP Rehab Barriers to Discharge: Barriers Resolved   Patient Goals and CMS Choice Patient states their goals for this hospitalization and ongoing recovery are:: return back home CMS Medicare.gov Compare Post Acute Care list provided to:: Patient Represenative (must comment) (Spouse-Marilyn) Choice offered to / list presented to : Spouse      Discharge Placement                Patient to be transferred to facility by: Spouse Jola Babinski POV Name of family member notified: Jola Babinski - spouse Patient and family notified of of transfer: 05/29/23  Discharge Plan and Services Additional resources added to the After Visit Summary for                  DME Arranged: N/A DME Agency: NA                  Social Drivers of Health (SDOH) Interventions SDOH Screenings   Food Insecurity: No Food Insecurity (05/28/2023)  Housing: Low Risk  (05/28/2023)  Transportation Needs: No Transportation Needs (05/28/2023)  Utilities: Patient Declined (05/28/2023)  Social Connections: Socially Integrated (05/28/2023)  Tobacco Use: Medium Risk (05/28/2023)     Readmission Risk Interventions     No data to display

## 2023-05-29 NOTE — Progress Notes (Addendum)
 PROGRESS NOTE    [name]  ZOX:[id number] DOB: 09/07/1950 DOA: 05/28/2023 PCP: Carylon Perches, MD  Subjective: Pt seen and examined. Went to bed normal on Friday night(05-25-2023). Woke up on Saturday AM(05-26-2023) with some generalized weakness and difficultly walking.  Sat in his recliner that AM and symptoms improved. Able to go out to dinner on Saturday night without issue.  Went to bed on Sunday night feeling normal. Woke up on Monday AM with left hand weakness.  Pt is right hand dominant. No headache. Still with clumsiness of left hand.  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 No prior hx of afib.    Hospital Course: HPI: Joseph Peterson is a pleasant 73 y.o. male with medical history significant for hypertension, hyperlipidemia, sleep apnea on CPAP, and COPD who presents with left-sided weakness.   Patient went to bed in his usual state of health the night of 05/25/2023 but was having difficulty ambulating when he woke the following morning.  He realized that his left leg was weak and also noted some left arm weakness.  His wife noticed a left facial droop.  Symptoms persist but have been slowly improving.  He denies any recent chest pain, palpitations, fever, or chills.   ED Course: Upon arrival to the ED, patient is found to be afebrile and saturating mid 90s on room air with elevated blood pressure.  EKG demonstrates sinus rhythm.  Labs are most notable for normal creatinine, normal WBC, and negative urinalysis.  MRI brain reveals acute infarct involving the posterior limb of the right internal capsule.  MRA head is negative.  Significant Events: Admitted 05/28/2023 for acute CVA posterior limb of right internal capsule   Significant Labs: WBC 8.2, HgB 17.4, Plt 272 Na 138, K 3.6, CO2 of 25, BUN 14, scr 1.04, glu 119 TC 221, HDL 44, LDL 149, TG 138  Significant Imaging Studies: Hip XR No acute fracture or dislocation  MRI brain/MRA Brain 8 mm acute infarct  involving the posterior limb of the right internal capsule. 2. Mild chronic microvascular ischemic changes. 3. No large vessel occlusion, high-grade stenosis, or aneurysm.   Antibiotic Therapy: Anti-infectives (From admission, onward)    None       Procedures:   Consultants:     Assessment and Plan: * Acute ischemic stroke (HCC) 05-29-2023 MRI Brain shows acute CVA right posterior internal capsule. Needs echo, PT/OT, carotid U/S. Have contacted cards. They will arrange for outpatient ziopatch 14 days.  Continue with ASA/plavix. ASA/Plavix for 3 weeks then just asa. Start statin.  Sleep apnea 05-29-2023 stable.  COPD (chronic obstructive pulmonary disease) (HCC) 05-29-2023 stable. No wheezing. Not exacerbated.  Essential hypertension 05-29-2023 target normotensive since he has had symptoms for over 48 hours now.   DVT prophylaxis: enoxaparin (LOVENOX) injection 40 mg Start: 05/28/23 2130    Code Status: Full Code Family Communication: no family at bedside. Pt is decisional Disposition Plan: return home Reason for continuing need for hospitalization: needs echo, carotid dopplers, PT/OT assessment prior to discharge.  Objective: Vitals:   05/29/23 0422 05/29/23 0500 05/29/23 0600 05/29/23 0800  BP:  (!) 152/81 133/68 127/60  Pulse:  68 62   Resp:  15 17 14   Temp: 97.9 F (36.6 C)   97.8 F (36.6 C)  TempSrc: Oral   Oral  SpO2:  97% 95%   Weight:      Height:        Intake/Output Summary (Last 24 hours) at 05/29/2023 1021 Last  data filed at 05/29/2023 0600 Gross per 24 hour  Intake 240 ml  Output 400 ml  Net -160 ml   Filed Weights   05/28/23 1610 05/28/23 2312  Weight: 113.4 kg 113.1 kg    Examination:  Physical Exam Vitals and nursing note reviewed.  HENT:     Head: Normocephalic and atraumatic.     Nose: Nose normal.  Eyes:     General: No scleral icterus. Cardiovascular:     Rate and Rhythm: Normal rate and regular rhythm.  Pulmonary:      Effort: Pulmonary effort is normal.     Breath sounds: Normal breath sounds.  Abdominal:     General: Abdomen is protuberant. Bowel sounds are normal. There is no distension.     Palpations: Abdomen is soft.  Musculoskeletal:     Right lower leg: No edema.     Left lower leg: No edema.  Skin:    General: Skin is warm and dry.     Capillary Refill: Capillary refill takes less than 2 seconds.  Neurological:     Mental Status: He is alert and oriented to person, place, and time.     Comments: Mild weakness. +4/5 left hand grip strength.  Slight drooping of left corner of mouth.  Slight blunting of left nasolabial fold.  No dysarthria.    Data Reviewed: I have personally reviewed following labs and imaging studies  CBC: Recent Labs  Lab 05/28/23 1820 05/29/23 0419  WBC 8.2 7.6  NEUTROABS 5.7  --   HGB 17.4* 16.3  HCT 50.8 47.5  MCV 89.1 89.3  PLT 272 268   Basic Metabolic Panel: Recent Labs  Lab 05/28/23 1820 05/29/23 0419  NA 138 138  K 3.6 3.0*  CL 100 103  CO2 25 27  GLUCOSE 119* 108*  BUN 14 14  CREATININE 1.04 0.89  CALCIUM 9.9 9.1   GFR: Estimated Creatinine Clearance: 90.3 mL/min (by C-G formula based on SCr of 0.89 mg/dL). Liver Function Tests: Recent Labs  Lab 05/28/23 1820  AST 32  ALT 37  ALKPHOS 31*  BILITOT 1.0  PROT 8.0  ALBUMIN 4.3   HbA1C: Recent Labs    05/28/23 1820  HGBA1C 6.5*   CBG: Recent Labs  Lab 05/28/23 1820  GLUCAP 124*   Lipid Profile: Recent Labs    05/29/23 0419  CHOL 221*  HDL 44  LDLCALC 149*  TRIG 138  CHOLHDL 5.0    Recent Results (from the past 240 hours)  MRSA Next Gen by PCR, Nasal     Status: None   Collection Time: 05/28/23 10:57 PM   Specimen: Nasal Mucosa; Nasal Swab  Result Value Ref Range Status   MRSA by PCR Next Gen NOT DETECTED NOT DETECTED Final    Comment: (NOTE) The GeneXpert MRSA Assay (FDA approved for NASAL specimens only), is one component of a comprehensive MRSA colonization  surveillance program. It is not intended to diagnose MRSA infection nor to guide or monitor treatment for MRSA infections. Test performance is not FDA approved in patients less than 34 years old. Performed at [facility], [address] [place], [place] [address]      Radiology Studies: DG Hip Lucienne Capers or Wo Pelvis 2-3 Views Left Result Date: 05/28/2023 CLINICAL DATA:  Left hip pain and left-sided weakness EXAM: DG HIP (WITH OR WITHOUT PELVIS) 2-3V LEFT COMPARISON:  None Available. FINDINGS: No acute fracture or dislocation. Degenerative changes pubic symphysis, both hips, SI joints and lower lumbar spine.  IMPRESSION: No acute fracture or dislocation. Electronically Signed   By: Minerva Fester M.D.   On: 05/28/2023 20:40   MR BRAIN WO CONTRAST Result Date: 05/28/2023 CLINICAL DATA:  Neuro deficit, concern for stroke, intermittent left-sided weakness and left-sided facial droop. EXAM: MRI HEAD WITHOUT CONTRAST MRA HEAD WITHOUT CONTRAST TECHNIQUE: Multiplanar, multi-echo pulse sequences of the brain and surrounding structures were acquired without intravenous contrast. Angiographic images of the Circle of Willis were acquired using MRA technique without intravenous contrast. COMPARISON:  None Available. FINDINGS: MRI HEAD FINDINGS Brain: There is an 8 mm focus of restricted diffusion involving the posterior limb of the right internal capsule. No evidence of intracranial hemorrhage. Nonspecific scattered foci of FLAIR hyperintensity in the periventricular and subcortical white matter favored to reflect mild chronic microvascular ischemic changes. No mass lesion or midline shift. Cerebellum is unremarkable. Normal appearance of midline structures. The basilar cisterns are patent. No extra-axial fluid collections. Ventricles: Normal size and configuration of the ventricles. Vascular: Skull base flow voids are visualized. Skull and upper cervical spine: No focal abnormality. Sinuses/Orbits: Mild focal mucosal  thickening in the right ethmoid sinus. Mucous retention cyst in the right maxillary sinus. Other: Mastoid air cells are clear. MRA HEAD FINDINGS Anterior circulation: The intracranial ICAs are patent bilaterally. There is no evidence of high-grade stenosis, aneurysm, or vascular malformation. The M1 segments and MCA bifurcations are patent bilaterally. The distal MCA branches are patent. A1 and A2 segments of the ACAs are patent bilaterally. Distal ACA branches are patent. Posterior circulation: The left vertebral artery is dominant. The right vertebral artery appears to terminate at the origin of the right PICA. Basilar artery is patent. The bilateral PCAs are patent. There is fetal origin of the right PCA. Posterior communicating arteries visualized bilaterally, larger on the right. Superior cerebellar arteries visualized bilaterally. PICA is visualized bilaterally. Anatomic variants: Fetal origin of the right PCA. IMPRESSION: MRI HEAD: 1. 8 mm acute infarct involving the posterior limb of the right internal capsule. 2. Mild chronic microvascular ischemic changes. MRA HEAD: 1. No large vessel occlusion, high-grade stenosis, or aneurysm. Electronically Signed   By: Emily Filbert M.D.   On: 05/28/2023 19:54   MR ANGIO HEAD WO CONTRAST Result Date: 05/28/2023 CLINICAL DATA:  Neuro deficit, concern for stroke, intermittent left-sided weakness and left-sided facial droop. EXAM: MRI HEAD WITHOUT CONTRAST MRA HEAD WITHOUT CONTRAST TECHNIQUE: Multiplanar, multi-echo pulse sequences of the brain and surrounding structures were acquired without intravenous contrast. Angiographic images of the Circle of Willis were acquired using MRA technique without intravenous contrast. COMPARISON:  None Available. FINDINGS: MRI HEAD FINDINGS Brain: There is an 8 mm focus of restricted diffusion involving the posterior limb of the right internal capsule. No evidence of intracranial hemorrhage. Nonspecific scattered foci of FLAIR  hyperintensity in the periventricular and subcortical white matter favored to reflect mild chronic microvascular ischemic changes. No mass lesion or midline shift. Cerebellum is unremarkable. Normal appearance of midline structures. The basilar cisterns are patent. No extra-axial fluid collections. Ventricles: Normal size and configuration of the ventricles. Vascular: Skull base flow voids are visualized. Skull and upper cervical spine: No focal abnormality. Sinuses/Orbits: Mild focal mucosal thickening in the right ethmoid sinus. Mucous retention cyst in the right maxillary sinus. Other: Mastoid air cells are clear. MRA HEAD FINDINGS Anterior circulation: The intracranial ICAs are patent bilaterally. There is no evidence of high-grade stenosis, aneurysm, or vascular malformation. The M1 segments and MCA bifurcations are patent bilaterally. The distal MCA branches are patent. A1  and A2 segments of the ACAs are patent bilaterally. Distal ACA branches are patent. Posterior circulation: The left vertebral artery is dominant. The right vertebral artery appears to terminate at the origin of the right PICA. Basilar artery is patent. The bilateral PCAs are patent. There is fetal origin of the right PCA. Posterior communicating arteries visualized bilaterally, larger on the right. Superior cerebellar arteries visualized bilaterally. PICA is visualized bilaterally. Anatomic variants: Fetal origin of the right PCA. IMPRESSION: MRI HEAD: 1. 8 mm acute infarct involving the posterior limb of the right internal capsule. 2. Mild chronic microvascular ischemic changes. MRA HEAD: 1. No large vessel occlusion, high-grade stenosis, or aneurysm. Electronically Signed   By: Emily Filbert M.D.   On: 05/28/2023 19:54    Scheduled Meds:  aspirin EC  81 mg Oral Daily   Chlorhexidine Gluconate Cloth  6 each Topical Q0600   clopidogrel  75 mg Oral Daily   enoxaparin (LOVENOX) injection  40 mg Subcutaneous Q24H   Continuous  Infusions:   LOS: 0 days   Time spent: 45 minutes  Carollee Herter, DO  [facility]  05/29/2023, 10:21 AM

## 2023-05-29 NOTE — Assessment & Plan Note (Signed)
 05-29-2023 stable. No wheezing. Not exacerbated.

## 2023-05-29 NOTE — Assessment & Plan Note (Signed)
 Estimated body mass index is 36.82 kg/m as calculated from the following:   Height as of this encounter: 5\' 9"  (1.753 m).   Weight as of this encounter: 113.1 kg.

## 2023-05-29 NOTE — Assessment & Plan Note (Signed)
 05-29-2023 MRI Brain shows acute CVA right posterior internal capsule. Needs echo, PT/OT, carotid U/S. Have contacted cards. They will arrange for outpatient ziopatch 14 days.  Continue with ASA/plavix. ASA/Plavix for 3 weeks then just asa. Start statin.

## 2023-05-29 NOTE — Assessment & Plan Note (Signed)
 05-29-2023 target normotensive since he has had symptoms for over 48 hours now.

## 2023-05-29 NOTE — Progress Notes (Signed)
*  PRELIMINARY RESULTS* Echocardiogram 2D Echocardiogram has been performed with Definity.  Stacey Drain 05/29/2023, 4:27 PM

## 2023-05-30 DIAGNOSIS — I639 Cerebral infarction, unspecified: Secondary | ICD-10-CM | POA: Diagnosis not present

## 2023-05-30 LAB — TSH: TSH: 3.772 u[IU]/mL (ref 0.350–4.500)

## 2023-05-30 LAB — T4, FREE: Free T4: 1.02 ng/dL (ref 0.61–1.12)

## 2023-05-30 MED ORDER — ASPIRIN 81 MG PO TBEC
81.0000 mg | DELAYED_RELEASE_TABLET | Freq: Every day | ORAL | 12 refills | Status: AC
Start: 2023-05-31 — End: ?

## 2023-05-30 MED ORDER — ATORVASTATIN CALCIUM 40 MG PO TABS: 40.0000 mg | ORAL_TABLET | Freq: Every day | ORAL | 2 refills | Status: AC

## 2023-05-30 MED ORDER — CLOPIDOGREL BISULFATE 75 MG PO TABS: 75.0000 mg | ORAL_TABLET | Freq: Every day | ORAL | 0 refills | Status: AC

## 2023-05-30 NOTE — Discharge Summary (Signed)
 Physician Discharge Summary   Patient: Joseph Peterson MRN: [id number] DOB: Aug 21, 1950  Admit date:     05/28/2023  Discharge date: 05/30/23  Discharge Physician: Kendell Bane   PCP: Carylon Perches, MD   Recommendations at discharge:   Follow-up with PCP in 1 week Follow-up with neurologist in 1-4 weeks Continue aspirin/Plavix for total of 21 days then continue with aspirin, continue statin Home PT OT  Discharge Diagnoses: Principal Problem:   Acute ischemic stroke [facility]) Active Problems:   Essential hypertension   COPD (chronic obstructive pulmonary disease) (HCC)   Sleep apnea   Obesity, Class II, BMI 35-39.9  Resolved Problems:   * No resolved hospital problems. *  Hospital Course:  Joseph Peterson is a pleasant 73 y.o. male with medical history significant for hypertension, hyperlipidemia, sleep apnea on CPAP, and COPD who presents with left-sided weakness.   Patient went to bed in his usual state of health the night of 05/25/2023 but was having difficulty ambulating when he woke the following morning.  He realized that his left leg was weak and also noted some left arm weakness.  His wife noticed a left facial droop.  Symptoms persist but have been slowly improving.  He denies any recent chest pain, palpitations, fever, or chills.   ED Course: Upon arrival to the ED, patient is found to be afebrile and saturating mid 90s on room air with elevated blood pressure.  EKG demonstrates sinus rhythm.  Labs are most notable for normal creatinine, normal WBC, and negative urinalysis.  MRI brain reveals acute infarct involving the posterior limb of the right internal capsule.  MRA head is negative.  Significant Events: Admitted 05/28/2023 for acute CVA posterior limb of right internal capsule   Significant Labs: WBC 8.2, HgB 17.4, Plt 272 Na 138, K 3.6, CO2 of 25, BUN 14, scr 1.04, glu 119 TC 221, HDL 44, LDL 149, TG 138  Significant Imaging Studies: Hip XR No acute fracture  or dislocation  MRI brain/MRA Brain 8 mm acute infarct involving the posterior limb of the right internal capsule. 2. Mild chronic microvascular ischemic changes. 3. No large vessel occlusion, high-grade stenosis, or aneurysm.     * Acute ischemic stroke (HCC) 05-29-2023 MRI Brain shows acute CVA right posterior internal capsule. Needs echo, PT/OT, carotid U/S. Have contacted cards.  They will arrange for outpatient ziopatch 14 days.   Continue with ASA/plavix. ASA/Plavix for 3 weeks then just ASA. Start statin. -Home health for PT OT has been arranged  Sleep apnea 05-29-2023 stable.  COPD (chronic obstructive pulmonary disease) (HCC) 05-29-2023 stable. No wheezing. Not exacerbated.  Essential hypertension 05-29-2023 target normotensive since he has had symptoms for over 48 hours now.  Obesity, Class II, BMI 35-39.9 Estimated body mass index is 36.82 kg/m as calculated from the following:   Height as of this encounter: 5\' 9"  (1.753 m).   Weight as of this encounter: 113.1 kg.   Consult: Neurology   Disposition: Home health Diet recommendation:  Discharge Diet Orders (From admission, onward)     Start     Ordered   05/30/23 0000  Diet - low sodium heart healthy        05/30/23 1227           Cardiac diet DISCHARGE MEDICATION: Allergies as of 05/30/2023   No Known Allergies      Medication List     TAKE these medications    amLODipine 10 MG tablet Commonly known as: NORVASC Take  10 mg by mouth daily.   aspirin EC 81 MG tablet Take 1 tablet (81 mg total) by mouth daily. Swallow whole. Start taking on: May 31, 2023   atorvastatin 40 MG tablet Commonly known as: LIPITOR Take 1 tablet (40 mg total) by mouth daily. Start taking on: May 31, 2023   clopidogrel 75 MG tablet Commonly known as: PLAVIX Take 1 tablet (75 mg total) by mouth daily. Start taking on: May 31, 2023   fenofibrate 160 MG tablet Take 160 mg by mouth daily.   hydrochlorothiazide  25 MG tablet Commonly known as: HYDRODIURIL Take 25 mg by mouth daily.        Follow-up Information     [facility] Follow up.   Specialty: Rehabilitation Why: An order has been place on your behalf to start OPPT. Please follow up to get scheduled start of care for PT. Contact information: [address] [place] [address] [phone]               Discharge Exam: Ceasar Mons Weights   05/28/23 1610 05/28/23 2312  Weight: 113.4 kg 113.1 kg     Condition at discharge: good  The results of significant diagnostics from this hospitalization (including imaging, microbiology, ancillary and laboratory) are listed below for reference.   Imaging Studies: US Carotid Bilateral (at [facility] and [facility] only) Result Date: 05/30/2023 CLINICAL DATA:  72 year old male with acute ischemic stroke EXAM: BILATERAL CAROTID DUPLEX ULTRASOUND TECHNIQUE: Wallace Cullens scale imaging, color Doppler and duplex ultrasound were performed of bilateral carotid and vertebral arteries in the neck. COMPARISON:  None Available. FINDINGS: Criteria: Quantification of carotid stenosis is based on velocity parameters that correlate the residual internal carotid diameter with NASCET-based stenosis levels, using the diameter of the distal internal carotid lumen as the denominator for stenosis measurement. The following velocity measurements were obtained: RIGHT ICA:  Systolic 65 cm/sec, Diastolic 12 cm/sec CCA:  92 cm/sec SYSTOLIC ICA/CCA RATIO:  0.7 ECA:  114 cm/sec LEFT ICA:  Systolic 98 cm/sec, Diastolic 20 cm/sec CCA:  93 cm/sec SYSTOLIC ICA/CCA RATIO:  1.0 ECA:  119 cm/sec Right Brachial SBP: Not acquired Left Brachial SBP: Not acquired RIGHT CAROTID ARTERY: No significant calcifications of the right common carotid artery. Intermediate waveform maintained. Heterogeneous and partially calcified plaque at the right carotid bifurcation. No significant lumen shadowing. Low  resistance waveform of the right ICA. No significant tortuosity. RIGHT VERTEBRAL ARTERY: Antegrade flow with low resistance waveform. LEFT CAROTID ARTERY: No significant calcifications of the left common carotid artery. Intermediate waveform maintained. Heterogeneous and partially calcified plaque at the left carotid bifurcation. No significant lumen shadowing. Low resistance waveform of the left ICA. No significant tortuosity. LEFT VERTEBRAL ARTERY:  Antegrade flow with low resistance waveform. IMPRESSION: Color duplex indicates minimal heterogeneous and calcified plaque, with no hemodynamically significant stenosis by duplex criteria in the extracranial cerebrovascular circulation. Signed, Yvone Neu. Miachel Roux, RPVI [facility] Radiology Electronically Signed   By: Gilmer Mor D.O.   On: 05/30/2023 08:35   ECHOCARDIOGRAM COMPLETE Result Date: 05/29/2023    ECHOCARDIOGRAM REPORT   Patient Name:   Joseph Peterson Date of Exam: 05/29/2023 Medical Rec #:  [id number]         Height:       69.0 in Accession #:    [id number]        Weight:       249.3 lb Date of Birth:  September 03, 1950  BSA:          2.269 m Patient Age:    73 years          BP:           133/68 mmHg Patient Gender: M                 HR:           62 bpm. [facility] Procedure: 2D Echo, Cardiac Doppler, Color Doppler and Intracardiac            Opacification Agent (Both Spectral and Color Flow Doppler were            utilized during procedure). Indications:    Stroke l63.9  History:        Patient has no prior history of Echocardiogram examinations.                 COPD; Risk Factors:Hypertension and Sleep Apnea.  Sonographer:    Celesta Gentile RCS Referring Phys: [id number] TIMOTHY S OPYD IMPRESSIONS  1. No evidence of LV thrombus by Definity. Left ventricular ejection fraction, by estimation, is 70 to 75%. The left ventricle has hyperdynamic function. The left ventricle has no  regional wall motion abnormalities. There is mild left ventricular hypertrophy. Left ventricular diastolic parameters are consistent with Grade I diastolic dysfunction (impaired relaxation).  2. Right ventricular systolic function is normal. The right ventricular size is normal. Tricuspid regurgitation signal is inadequate for assessing PA pressure.  3. The mitral valve is abnormal. No evidence of mitral valve regurgitation. No evidence of mitral stenosis. Moderate mitral annular calcification.  4. The aortic valve is tricuspid. Aortic valve regurgitation is not visualized. No aortic stenosis is present.  5. Aortic dilatation noted. There is mild dilatation of the aortic root, measuring 41 mm.  6. Increased flow velocities may be secondary to anemia, thyrotoxicosis, hyperdynamic or high flow state. Comparison(s): No prior Echocardiogram. FINDINGS  Left Ventricle: No evidence of LV thrombus by Definity. Left ventricular ejection fraction, by estimation, is 70 to 75%. The left ventricle has hyperdynamic function. The left ventricle has no regional wall motion abnormalities. Definity contrast agent was given IV to delineate the left ventricular endocardial borders. Strain was performed and the global longitudinal strain is indeterminate. The left ventricular internal cavity size was normal in size. There is mild left ventricular hypertrophy. Left ventricular diastolic parameters are consistent with Grade I diastolic dysfunction (impaired relaxation). Normal left ventricular filling pressure. Right Ventricle: The right ventricular size is normal. No increase in right ventricular wall thickness. Right ventricular systolic function is normal. Tricuspid regurgitation signal is inadequate for assessing PA pressure. Left Atrium: Left atrial size was normal in size. Right Atrium: Right atrial size was normal in size. Pericardium: There is no evidence of pericardial effusion. Mitral Valve: The mitral valve is abnormal.  Moderate mitral annular calcification. No evidence of mitral valve regurgitation. No evidence of mitral valve stenosis. Tricuspid Valve: The tricuspid valve is normal in structure. Tricuspid valve regurgitation is not demonstrated. No evidence of tricuspid stenosis. Aortic Valve: The aortic valve is tricuspid. Aortic valve regurgitation is not visualized. No aortic stenosis is present. Aortic valve mean gradient measures 6.0 mmHg. Aortic valve peak gradient measures 10.8 mmHg. Aortic valve area, by VTI measures 3.32  cm. Pulmonic Valve: The pulmonic valve was normal in structure. Pulmonic valve regurgitation is not visualized. No evidence of pulmonic stenosis. Aorta: Aortic dilatation noted. There is mild dilatation of the aortic root,  measuring 41 mm. Venous: The inferior vena cava was not well visualized. IAS/Shunts: The interatrial septum was not well visualized. Additional Comments: 3D was performed not requiring image post processing on an independent workstation and was indeterminate.  LEFT VENTRICLE PLAX 2D LVIDd:         4.00 cm   Diastology LVIDs:         2.40 cm   LV e' medial:    7.62 cm/s LV PW:         1.20 cm   LV E/e' medial:  12.1 LV IVS:        1.30 cm   LV e' lateral:   9.68 cm/s LVOT diam:     2.20 cm   LV E/e' lateral: 9.5 LV SV:         114 LV SV Index:   50 LVOT Area:     3.80 cm  RIGHT VENTRICLE RV S prime:     18.40 cm/s TAPSE (M-mode): 2.2 cm LEFT ATRIUM             Index LA diam:        3.70 cm 1.63 cm/m LA Vol (A2C):   58.7 ml 25.87 ml/m LA Vol (A4C):   62.8 ml 27.68 ml/m LA Biplane Vol: 64.4 ml 28.38 ml/m  AORTIC VALVE AV Area (Vmax):    3.59 cm AV Area (Vmean):   3.48 cm AV Area (VTI):     3.32 cm AV Vmax:           164.00 cm/s AV Vmean:          119.000 cm/s AV VTI:            0.343 m AV Peak Grad:      10.8 mmHg AV Mean Grad:      6.0 mmHg LVOT Vmax:         155.00 cm/s LVOT Vmean:        109.000 cm/s LVOT VTI:          0.300 m LVOT/AV VTI ratio: 0.87  AORTA Ao Root diam: 4.10  cm MITRAL VALVE MV Area (PHT): 2.26 cm     SHUNTS MV Decel Time: 335 msec     Systemic VTI:  0.30 m MV E velocity: 92.10 cm/s   Systemic Diam: 2.20 cm MV A velocity: 124.00 cm/s MV E/A ratio:  0.74 Vishnu Priya Mallipeddi Electronically signed by Winfield Rast Mallipeddi Signature Date/Time: 05/29/2023/4:58:02 PM    Final    DG Hip Unilat W or Wo Pelvis 2-3 Views Left Result Date: 05/28/2023 CLINICAL DATA:  Left hip pain and left-sided weakness EXAM: DG HIP (WITH OR WITHOUT PELVIS) 2-3V LEFT COMPARISON:  None Available. FINDINGS: No acute fracture or dislocation. Degenerative changes pubic symphysis, both hips, SI joints and lower lumbar spine. IMPRESSION: No acute fracture or dislocation. Electronically Signed   By: Minerva Fester M.D.   On: 05/28/2023 20:40   MR BRAIN WO CONTRAST Result Date: 05/28/2023 CLINICAL DATA:  Neuro deficit, concern for stroke, intermittent left-sided weakness and left-sided facial droop. EXAM: MRI HEAD WITHOUT CONTRAST MRA HEAD WITHOUT CONTRAST TECHNIQUE: Multiplanar, multi-echo pulse sequences of the brain and surrounding structures were acquired without intravenous contrast. Angiographic images of the Circle of Willis were acquired using MRA technique without intravenous contrast. COMPARISON:  None Available. FINDINGS: MRI HEAD FINDINGS Brain: There is an 8 mm focus of restricted diffusion involving the posterior limb of the right internal capsule. No evidence of intracranial hemorrhage. Nonspecific scattered foci  of FLAIR hyperintensity in the periventricular and subcortical white matter favored to reflect mild chronic microvascular ischemic changes. No mass lesion or midline shift. Cerebellum is unremarkable. Normal appearance of midline structures. The basilar cisterns are patent. No extra-axial fluid collections. Ventricles: Normal size and configuration of the ventricles. Vascular: Skull base flow voids are visualized. Skull and upper cervical spine: No focal abnormality.  Sinuses/Orbits: Mild focal mucosal thickening in the right ethmoid sinus. Mucous retention cyst in the right maxillary sinus. Other: Mastoid air cells are clear. MRA HEAD FINDINGS Anterior circulation: The intracranial ICAs are patent bilaterally. There is no evidence of high-grade stenosis, aneurysm, or vascular malformation. The M1 segments and MCA bifurcations are patent bilaterally. The distal MCA branches are patent. A1 and A2 segments of the ACAs are patent bilaterally. Distal ACA branches are patent. Posterior circulation: The left vertebral artery is dominant. The right vertebral artery appears to terminate at the origin of the right PICA. Basilar artery is patent. The bilateral PCAs are patent. There is fetal origin of the right PCA. Posterior communicating arteries visualized bilaterally, larger on the right. Superior cerebellar arteries visualized bilaterally. PICA is visualized bilaterally. Anatomic variants: Fetal origin of the right PCA. IMPRESSION: MRI HEAD: 1. 8 mm acute infarct involving the posterior limb of the right internal capsule. 2. Mild chronic microvascular ischemic changes. MRA HEAD: 1. No large vessel occlusion, high-grade stenosis, or aneurysm. Electronically Signed   By: Emily Filbert M.D.   On: 05/28/2023 19:54   MR ANGIO HEAD WO CONTRAST Result Date: 05/28/2023 CLINICAL DATA:  Neuro deficit, concern for stroke, intermittent left-sided weakness and left-sided facial droop. EXAM: MRI HEAD WITHOUT CONTRAST MRA HEAD WITHOUT CONTRAST TECHNIQUE: Multiplanar, multi-echo pulse sequences of the brain and surrounding structures were acquired without intravenous contrast. Angiographic images of the Circle of Willis were acquired using MRA technique without intravenous contrast. COMPARISON:  None Available. FINDINGS: MRI HEAD FINDINGS Brain: There is an 8 mm focus of restricted diffusion involving the posterior limb of the right internal capsule. No evidence of intracranial hemorrhage.  Nonspecific scattered foci of FLAIR hyperintensity in the periventricular and subcortical white matter favored to reflect mild chronic microvascular ischemic changes. No mass lesion or midline shift. Cerebellum is unremarkable. Normal appearance of midline structures. The basilar cisterns are patent. No extra-axial fluid collections. Ventricles: Normal size and configuration of the ventricles. Vascular: Skull base flow voids are visualized. Skull and upper cervical spine: No focal abnormality. Sinuses/Orbits: Mild focal mucosal thickening in the right ethmoid sinus. Mucous retention cyst in the right maxillary sinus. Other: Mastoid air cells are clear. MRA HEAD FINDINGS Anterior circulation: The intracranial ICAs are patent bilaterally. There is no evidence of high-grade stenosis, aneurysm, or vascular malformation. The M1 segments and MCA bifurcations are patent bilaterally. The distal MCA branches are patent. A1 and A2 segments of the ACAs are patent bilaterally. Distal ACA branches are patent. Posterior circulation: The left vertebral artery is dominant. The right vertebral artery appears to terminate at the origin of the right PICA. Basilar artery is patent. The bilateral PCAs are patent. There is fetal origin of the right PCA. Posterior communicating arteries visualized bilaterally, larger on the right. Superior cerebellar arteries visualized bilaterally. PICA is visualized bilaterally. Anatomic variants: Fetal origin of the right PCA. IMPRESSION: MRI HEAD: 1. 8 mm acute infarct involving the posterior limb of the right internal capsule. 2. Mild chronic microvascular ischemic changes. MRA HEAD: 1. No large vessel occlusion, high-grade stenosis, or aneurysm. Electronically Signed   By: Molli Hazard  Hunt M.D.   On: 05/28/2023 19:54    Microbiology: Results for orders placed or performed during the hospital encounter of 05/28/23  MRSA Next Gen by PCR, Nasal     Status: None   Collection Time: 05/28/23 10:57 PM    Specimen: Nasal Mucosa; Nasal Swab  Result Value Ref Range Status   MRSA by PCR Next Gen NOT DETECTED NOT DETECTED Final    Comment: (NOTE) The GeneXpert MRSA Assay (FDA approved for NASAL specimens only), is one component of a comprehensive MRSA colonization surveillance program. It is not intended to diagnose MRSA infection nor to guide or monitor treatment for MRSA infections. Test performance is not FDA approved in patients less than 36 years old. Performed at [facility], [address], [place], [place] [address]     Labs: CBC: Recent Labs  Lab 05/28/23 1820 05/29/23 0419  WBC 8.2 7.6  NEUTROABS 5.7  --   HGB 17.4* 16.3  HCT 50.8 47.5  MCV 89.1 89.3  PLT 272 268   Basic Metabolic Panel: Recent Labs  Lab 05/28/23 1820 05/29/23 0419  NA 138 138  K 3.6 3.0*  CL 100 103  CO2 25 27  GLUCOSE 119* 108*  BUN 14 14  CREATININE 1.04 0.89  CALCIUM 9.9 9.1   Liver Function Tests: Recent Labs  Lab 05/28/23 1820  AST 32  ALT 37  ALKPHOS 31*  BILITOT 1.0  PROT 8.0  ALBUMIN 4.3   CBG: Recent Labs  Lab 05/28/23 1820  GLUCAP 124*    Discharge time spent: greater than 40 minutes.  Signed: Kendell Bane, MD [facility] 05/30/2023

## 2023-05-30 NOTE — Consult Note (Signed)
 I connected with  [name] on 05/30/23 by a video enabled telemedicine application and verified that I am speaking with the correct person using two identifiers.   I discussed the limitations of evaluation and management by telemedicine. The patient expressed understanding and agreed to proceed.  Location of patient: [facility] Location of physician: [facility]  Neurology Consultation Reason for Consult: Stroke Referring Physician: Dr. Marcial Pacas Opyd  CC: Left-sided weakness  History is obtained from: Patient, chart review  HPI: Joseph Peterson is a 73 y.o. male with past medical history of hypertension, hyperlipidemia, sleep apnea on CPAP, COPD who presented with acute onset left-sided weakness.  Patient states he went to bed on 05/25/2023 at around midnight and was normal at the time.  He then he woke up next morning he noticed that his left arm and leg was weak.  His wife also noticed some facial droop.  When symptoms did not improve in the next couple of days, eventually came to the hospital on 05/28/2023.  Denies similar symptoms in the past.  Not on any antiplatelets/anticoagulants.  Last known normal: 05/25/2023 at around midnight Event happened at home No tPA as outside window No thrombectomy as no large vessel occlusion mRs 0   ROS: All other systems reviewed and negative except as noted in the HPI.   Past Medical History:  Diagnosis Date   COPD (chronic obstructive pulmonary disease) (HCC)    Hypercholesteremia    Hypertension    S/P TKR (total knee replacement) using cement, right 05/21/2018   Sleep apnea     Family History  Problem Relation Age of Onset   Colon cancer Neg Hx     Social History:  reports that he has quit smoking. He has quit using smokeless tobacco. He reports current alcohol use. He reports that he does not use drugs.   Medications Prior to Admission  Medication Sig Dispense Refill Last Dose/Taking   amLODipine (NORVASC)  10 MG tablet Take 10 mg by mouth daily.   05/28/2023 Morning   fenofibrate 160 MG tablet Take 160 mg by mouth daily.   05/28/2023 Morning   hydrochlorothiazide (HYDRODIURIL) 25 MG tablet Take 25 mg by mouth daily.   05/28/2023 Morning      Exam: Current vital signs: BP (!) 152/91   Pulse (!) 54   Temp 98.2 F (36.8 C) (Oral)   Resp 17   Ht 5\' 9"  (1.753 m)   Wt 113.1 kg   SpO2 92%   BMI 36.82 kg/m  Vital signs in last 24 hours: Temp:  [97 F (36.1 C)-98.2 F (36.8 C)] 98.2 F (36.8 C) (03/05 1156) Pulse Rate:  [54-91] 54 (03/05 0500) Resp:  [11-19] 17 (03/05 0721) BP: (109-169)/(72-91) 152/91 (03/05 0802) SpO2:  [90 %-97 %] 92 % (03/05 0500) FiO2 (%):  [21 %] 21 % (03/04 2256)   Physical Exam  Constitutional: Appears well-developed and well-nourished.  Psych: Affect appropriate to situation Neuro: AOx3, no aphasia, no dysarthria, cranial nerves appear grossly intact except flattening of left nasolabial fold, antigravity strength in all 4 extremities with subtle drift in left upper extremity, normal sensation in all 4 extremities to light touch, FTN intact bilaterally  NIHSS 2   INPUTS: 1A: Level of consciousness --> 0 = Alert; keenly responsive 1B: Ask month and age --> 0 = Both questions right 1C: 'Blink eyes' & 'squeeze hands' --> 0 = Performs both tasks 2: Horizontal extraocular movements --> 0 = Normal 3: Visual fields -->  0 = No visual loss 4: Facial palsy --> 1 = Minor paralysis (flat nasolabial fold, smile asymmetry) 5A: Left arm motor drift --> 1 = Drift, but doesn't hit bed 5B: Right arm motor drift --> 0 = No drift for 10 seconds 6A: Left leg motor drift --> 0 = No drift for 5 seconds 6B: Right leg motor drift --> 0 = No drift for 5 seconds 7: Limb Ataxia --> 0 = No ataxia 8: Sensation --> 0 = Normal; no sensory loss 9: Language/aphasia --> 0 = Normal; no aphasia 10: Dysarthria --> 0 = Normal 11: Extinction/inattention --> 0 = No abnormality    I have  reviewed labs in epic and the results pertinent to this consultation are: CBC:  Recent Labs  Lab 05/28/23 1820 05/29/23 0419  WBC 8.2 7.6  NEUTROABS 5.7  --   HGB 17.4* 16.3  HCT 50.8 47.5  MCV 89.1 89.3  PLT 272 268    Basic Metabolic Panel:  Lab Results  Component Value Date   NA 138 05/29/2023   K 3.0 (L) 05/29/2023   CO2 27 05/29/2023   GLUCOSE 108 (H) 05/29/2023   BUN 14 05/29/2023   CREATININE 0.89 05/29/2023   CALCIUM 9.1 05/29/2023   GFRNONAA >60 05/29/2023   GFRAA >60 05/23/2018   Lipid Panel:  Lab Results  Component Value Date   LDLCALC 149 (H) 05/29/2023   HgbA1c:  Lab Results  Component Value Date   HGBA1C 6.5 (H) 05/28/2023   Urine Drug Screen: No results found for: "LABOPIA", "COCAINSCRNUR", "LABBENZ", "AMPHETMU", "THCU", "LABBARB"  Alcohol Level No results found for: "ETH"   I have reviewed the images obtained:  MRI brain without contrast 05/28/2023:  8 mm acute infarct involving the posterior limb of the right internal capsule. Mild chronic microvascular ischemic changes.  MRA head without contrast 05/28/2023: No large vessel occlusion, high-grade stenosis, or aneurysm   US carotid bilateral 05/29/2023: Color duplex indicates minimal heterogeneous and calcified plaque, with no hemodynamically significant stenosis by duplex criteria in the extracranial cerebrovascular circulation.  TTE 05/29/2023: No LV thrombus.  Ejection fraction 77%, no regional wall motion abnormalities.  Interatrial shunt was not well-visualized.     ASSESSMENT/PLAN: 73 year old male presented with left arm and leg weakness.  Acute ischemic stroke Radiology: Likely small vessel disease versus embolic  Recommendations: -Recommend aspirin 81 mg daily and Plavix 75 mg daily for 3 weeks followed by monotherapy with aspirin 81 mg daily -Recommend atorvastatin 40 mg daily -Cardiac monitor to look for paroxysmal A-fib -PT/OT -Stroke education -Goal blood pressure:  Normotension -Follow-up with neurology in 3 months  -Discussed plan with patient and wife at bedside -Discussed plan with Dr. Flossie Dibble via secure chat   Thank you for allowing [place] to participate in the care of this patient. If you have any further questions, please contact  me or neurohospitalist.   Lindie Spruce Epilepsy [facility] neurohospitalist

## 2023-05-30 NOTE — Progress Notes (Signed)
 Left VM at [facility] Neurology  [phone].  Neuro consult requested 05/28/23. Left [facility] # for questions/call back.

## 2023-05-30 NOTE — TOC Transition Note (Signed)
 Transition of Care [facility]) - Discharge Note   Patient Details  Name: Joseph Peterson MRN: [id number] Date of Birth: [birth year]  Transition of Care [facility]) CM/SW Contact:  Isabella Bowens, LCSWA Phone Number: 05/30/2023, 10:15 AM   Clinical Narrative:    Patient did not DC yesterday, but is DC ready today, per MD. OPPT order was placed with [facility] [place]. MD made aware to cosign order. Spouse will provide transportation.    Final next level of care: OP Rehab Barriers to Discharge: Barriers Resolved   Patient Goals and CMS Choice Patient states their goals for this hospitalization and ongoing recovery are:: return back home CMS Medicare.gov Compare Post Acute Care list provided to:: Patient Choice offered to / list presented to : Patient      Discharge Placement  Home with spouse               Patient to be transferred to facility by: Spouse - Jola Babinski POV Name of family member notified: Jola Babinski - spouse Patient and family notified of of transfer: 05/30/23  Discharge Plan and Services Additional resources added to the After Visit Summary for                  DME Arranged: N/A DME Agency: NA       HH Arranged: Refused HH HH Agency: NA        Social Drivers of Health (SDOH) Interventions SDOH Screenings   Food Insecurity: No Food Insecurity (05/28/2023)  Housing: Low Risk  (05/28/2023)  Transportation Needs: No Transportation Needs (05/28/2023)  Utilities: Patient Declined (05/28/2023)  Social Connections: Socially Integrated (05/28/2023)  Tobacco Use: Medium Risk (05/28/2023)     Readmission Risk Interventions     No data to display

## 2023-05-30 NOTE — Evaluation (Signed)
 Speech Language Pathology Evaluation Patient Details Name: Joseph Peterson MRN: [id number] DOB: 1951/01/05 Today's Date: 05/30/2023 Time: 4696-2952 SLP Time Calculation (min) (ACUTE ONLY): 23 min  Problem List:  Patient Active Problem List   Diagnosis Date Noted   Obesity, Class II, BMI 35-39.9 05/29/2023   Acute ischemic stroke (HCC) 05/28/2023   Essential hypertension    COPD (chronic obstructive pulmonary disease) (HCC)    Sleep apnea    History of adenomatous polyp of colon 02/17/2020   Past Medical History:  Past Medical History:  Diagnosis Date   COPD (chronic obstructive pulmonary disease) (HCC)    Hypercholesteremia    Hypertension    S/P TKR (total knee replacement) using cement, right 05/21/2018   Sleep apnea    Past Surgical History:  Past Surgical History:  Procedure Laterality Date   COLONOSCOPY N/A 05/26/2013   Procedure: COLONOSCOPY;  Surgeon: Corbin Ade, MD;  [facility];  Service: Endoscopy;  Laterality: N/A;  1:00 PM-moved to [address] notified pt   COLONOSCOPY WITH PROPOFOL N/A 04/08/2020   Procedure: COLONOSCOPY WITH PROPOFOL;  Surgeon: Corbin Ade, MD;  Location: [facility];  Service: Endoscopy;  Laterality: N/A;  10:30am   POLYPECTOMY  04/08/2020   Procedure: POLYPECTOMY;  Surgeon: Corbin Ade, MD;  Location: [facility];  Service: Endoscopy;;   TOTAL KNEE ARTHROPLASTY Right 05/21/2018   Procedure: TOTAL KNEE ARTHROPLASTY-RIGHT;  Surgeon: Kennedy Bucker, MD;  Location: [facility] ORS;  Service: Orthopedics;  Laterality: Right;   HPI:  Joseph Peterson is a pleasant 73 y.o. male with medical history significant for hypertension, hyperlipidemia, sleep apnea on CPAP, and COPD who presents with left-sided weakness.  MRI brain reveals acute infarct involving the posterior limb of the right internal capsule.  MRA head is negative. SLE requested.   Assessment / Plan / Recommendation Clinical Impression  Pt appears to be at baseline  cog/ling and no changes with this acute CVA. Pt is HOH. He is oriented to situation, expressed normal concers regarding falls risk going home, without dysarthria or aphasia. His wife is present and further confirms no changes in cog/ling. He presents with mildly rough vocal quality and both Pt and spouse indicate this is baseline. No further SLP services indicated at this time. SLP will sign off.    SLP Assessment  SLP Recommendation/Assessment: Patient does not need any further Speech Lanaguage Pathology Services SLP Visit Diagnosis: Cognitive communication deficit (R41.841)    Recommendations for follow up therapy are one component of a multi-disciplinary discharge planning process, led by the attending physician.  Recommendations may be updated based on patient status, additional functional criteria and insurance authorization.    Follow Up Recommendations  No SLP follow up    Assistance Recommended at Discharge     Functional Status Assessment Patient has not had a recent decline in their functional status  Frequency and Duration           SLP Evaluation Cognition  Overall Cognitive Status: Within Functional Limits for tasks assessed Arousal/Alertness: Awake/alert Orientation Level: Oriented X4 Year: 2025 Month: March Day of Week: Correct Memory: Appears intact Awareness: Appears intact Problem Solving: Appears intact Safety/Judgment: Appears intact       Comprehension  Auditory Comprehension Overall Auditory Comprehension: Appears within functional limits for tasks assessed Yes/No Questions: Within Functional Limits Commands: Within Functional Limits Conversation: Complex Interfering Components: Hearing EffectiveTechniques: Increased volume Visual Recognition/Discrimination Discrimination: Within Function Limits Reading Comprehension Reading Status: Within funtional limits  Expression Expression Primary Mode of Expression: Verbal Verbal Expression Overall Verbal  Expression: Appears within functional limits for tasks assessed Initiation: No impairment Automatic Speech: Name;Social Response;Counting Level of Generative/Spontaneous Verbalization: Conversation Repetition: No impairment Naming: No impairment Pragmatics: No impairment Non-Verbal Means of Communication: Not applicable Written Expression Dominant Hand: Right Written Expression: Not tested   Oral / Motor  Oral Motor/Sensory Function Overall Oral Motor/Sensory Function: Mild impairment Facial Symmetry: Abnormal symmetry left;Suspected CN VII (facial) dysfunction (very mild and improved from admission) Facial Strength: Within Functional Limits Facial Sensation: Within Functional Limits Lingual ROM: Within Functional Limits Lingual Symmetry: Within Functional Limits Lingual Strength: Within Functional Limits Lingual Sensation: Within Functional Limits Velum: Within Functional Limits Mandible: Within Functional Limits Motor Speech Overall Motor Speech: Appears within functional limits for tasks assessed Respiration: Within functional limits Phonation: Normal Resonance: Within functional limits Articulation: Within functional limitis Intelligibility: Intelligible Motor Planning: Witnin functional limits Motor Speech Errors: Not applicable Interfering Components: Hearing loss           Thank you,  Havery Moros, CCC-SLP [phone]  Clemons Salvucci 05/30/2023, 12:59 PM

## 2023-05-30 NOTE — Plan of Care (Signed)

## 2023-05-30 NOTE — Progress Notes (Signed)
 Physical Therapy Treatment Patient Details Name: Joseph Peterson MRN: [id number] DOB: 13-Nov-1950 Today's Date: 05/30/2023   History of Present Illness Joseph Peterson is a pleasant 73 y.o. male with medical history significant for hypertension, hyperlipidemia, sleep apnea on CPAP, and COPD who presents with left-sided weakness.     Patient went to bed in his usual state of health the night of 05/25/2023 but was having difficulty ambulating when he woke the following morning.  He realized that his left leg was weak and also noted some left arm weakness.  His wife noticed a left facial droop.  Symptoms persist but have been slowly improving.  He denies any recent chest pain, palpitations, fever, or chills.    PT Comments  Patient presents seated in chair and agreeable for therapy. Patient requested use of RW for gait training due to c/o fatigue, able to ambulate in hallway without loss of balance using RW, but had occasional slipping of left hand off RW due to weakness and demonstrates fair/good return for going up/down steps in stair with understanding acknowledged by patient and his spouse.  Patient tolerated sitting up in chair after therapy. Patient will benefit from continued skilled physical therapy in hospital and recommended venue below to increase strength, balance, endurance for safe ADLs and gait.     If plan is discharge home, recommend the following: A little help with walking and/or transfers;A little help with bathing/dressing/bathroom;Help with stairs or ramp for entrance;Assistance with cooking/housework   Can travel by private vehicle        Equipment Recommendations  None recommended by PT    Recommendations for Other Services       Precautions / Restrictions Precautions Precautions: Fall Recall of Precautions/Restrictions: Intact Restrictions Weight Bearing Restrictions Per Provider Order: No     Mobility  Bed Mobility               General bed mobility  comments: Patient presents seated in chair    Transfers Overall transfer level: Needs assistance Equipment used: Rolling walker (2 wheels), None   Sit to Stand: Modified independent (Device/Increase time), Supervision   Step pivot transfers: Supervision, Modified independent (Device/Increase time)       General transfer comment: slightly labored movement with good return for transferring to/from chair    Ambulation/Gait Ambulation/Gait assistance: Supervision Gait Distance (Feet): 100 Feet Assistive device: Rolling walker (2 wheels) Gait Pattern/deviations: Decreased step length - right, Decreased step length - left, Decreased stride length, Trunk flexed Gait velocity: decreased     General Gait Details: has to frequently lean on nearby objects for support when not using an AD, patient safer using RW for gait training without loss of blaance, but had difficulty gripping RW with left hand due to weakness   Stairs Stairs: Yes Stairs assistance: Supervision, Contact guard assist Stair Management: Two rails Number of Stairs: 4 General stair comments: patient demonstrates fair/good return for going up/down stairs in stairwell using bilateral side rails without loss of balance and understanding acknowledged by patient and his spouse   Wheelchair Mobility     Tilt Bed    Modified Rankin (Stroke Patients Only)       Balance Overall balance assessment: Needs assistance Sitting-balance support: Feet supported, No upper extremity supported Sitting balance-Leahy Scale: Good Sitting balance - Comments: seated at EOB   Standing balance support: During functional activity, No upper extremity supported Standing balance-Leahy Scale: Fair Standing balance comment: fair/good using RW  Communication Communication Communication: No apparent difficulties  Cognition Arousal: Alert Behavior During Therapy: WFL for tasks assessed/performed    PT - Cognitive impairments: No apparent impairments                         Following commands: Intact      Cueing Cueing Techniques: Verbal cues  Exercises      General Comments        Pertinent Vitals/Pain Pain Assessment Pain Assessment: No/denies pain    Home Living     Available Help at Discharge: Family;Available 24 hours/day Type of Home: House                  Prior Function            PT Goals (current goals can now be found in the care plan section) Acute Rehab PT Goals Patient Stated Goal: return home with family to assist PT Goal Formulation: With patient/family Time For Goal Achievement: 05/31/23 Potential to Achieve Goals: Good Progress towards PT goals: Progressing toward goals    Frequency    Min 3X/week      PT Plan      Co-evaluation              AM-PAC PT "6 Clicks" Mobility   Outcome Measure  Help needed turning from your back to your side while in a flat bed without using bedrails?: None Help needed moving from lying on your back to sitting on the side of a flat bed without using bedrails?: A Little Help needed moving to and from a bed to a chair (including a wheelchair)?: None Help needed standing up from a chair using your arms (e.g., wheelchair or bedside chair)?: None Help needed to walk in hospital room?: A Little Help needed climbing 3-5 steps with a railing? : A Little 6 Click Score: 21    End of Session   Activity Tolerance: Patient tolerated treatment well;Patient limited by fatigue Patient left: in chair;with call bell/phone within reach;with family/visitor present Nurse Communication: Mobility status PT Visit Diagnosis: Unsteadiness on feet (R26.81);Other abnormalities of gait and mobility (R26.89);Muscle weakness (generalized) (M62.81)     Time: 2956-2130 PT Time Calculation (min) (ACUTE ONLY): 20 min  Charges:    $Gait Training: 8-22 mins $Therapeutic Activity: 8-22 mins PT General  Charges $$ ACUTE PT VISIT: 1 Visit                     2:51 PM, 05/30/23 Ocie Bob, MPT Physical Therapist with [facility] 336 [phone] office [phone] mobile phone

## 2023-05-31 ENCOUNTER — Encounter: Payer: Self-pay | Admitting: Neurology

## 2023-06-01 NOTE — Progress Notes (Signed)
 Initial neurology clinic note  Joseph Peterson MRN: [id number] DOB: 03-23-1951  Referring provider: Kendell Bane, MD  Primary care provider: Carylon Perches, MD  Reason for consult:  stroke  Subjective:  This is Joseph Peterson, a 73 y.o. right-handed male with a medical history of HTN, HLD, OSA on CPAP, DM, COPD, former smoker who presents to neurology clinic with stroke. The patient is accompanied by wife.  Patient was in normal state of health when going to bed on 05/25/23. He woke up on 05/26/23. He had difficulty with balance and had to sit down. This improved over 1-2 hours. He then went out and did errands with everything seeming okay. Wife did not notice anything off. He also was doing well on 05/27/23. He woke up on 05/28/23. He was noticing difficulty getting out of his chair and couldn't button his shirt. He asked his wife to take him to the hospital.   iNIHSS was 2 (1 facial palsy, 1 LUE drift). MRI brain showed acute infarct in the posterior limb of the right internal capsule. MRA head showed no large vessel occlusion or high grade stenosis. Carotid ultrasound showed no significant stenosis. TEE showed no significant abnormalities.   He was started on DAPT with asa 81 mg and plavix 75 mg daily for 3 weeks with plan of asa monotherapy thereafter. He is taking these. He was also started on atorvastatin 40 mg daily. He is not taking this due to prior joint pain. He does not think he can take a statin. He did not take atorvastatin. He is taking fenofibrate for a while. LDL was 149 during recent hospitalization despite fenofibrate.  Since the hospitalization, he feels like he is getting stronger. He is walking with a walker. He feels his left arm and leg are weak, but getting stronger. He has already started PT.  He is a former smoker (quit ~1996). Prior to the stroke, he would drink 2-3 glasses of wine per night. He endorses occasionally smoking cigars.  He denies any  constitutional symptoms like fevers, night sweats, or unintentional weight loss.  MEDICATIONS:  Outpatient Encounter Medications as of 06/07/2023  Medication Sig   amLODipine (NORVASC) 10 MG tablet Take 10 mg by mouth daily.   aspirin EC 81 MG tablet Take 1 tablet (81 mg total) by mouth daily. Swallow whole.   clopidogrel (PLAVIX) 75 MG tablet Take 1 tablet (75 mg total) by mouth daily.   fenofibrate 160 MG tablet Take 160 mg by mouth daily.   hydrochlorothiazide (HYDRODIURIL) 25 MG tablet Take 25 mg by mouth daily.   atorvastatin (LIPITOR) 40 MG tablet Take 1 tablet (40 mg total) by mouth daily. (Patient not taking: Reported on 06/07/2023)   No facility-administered encounter medications on file as of 06/07/2023.    PAST MEDICAL HISTORY: Past Medical History:  Diagnosis Date   COPD (chronic obstructive pulmonary disease) (HCC)    Hypercholesteremia    Hypertension    S/P TKR (total knee replacement) using cement, right 05/21/2018   Sleep apnea     PAST SURGICAL HISTORY: Past Surgical History:  Procedure Laterality Date   COLONOSCOPY N/A 05/26/2013   Procedure: COLONOSCOPY;  Surgeon: Corbin Ade, MD;  [facility];  Service: Endoscopy;  Laterality: N/A;  1:00 PM-moved to [address] notified pt   COLONOSCOPY WITH PROPOFOL N/A 04/08/2020   Procedure: COLONOSCOPY WITH PROPOFOL;  Surgeon: Corbin Ade, MD;  [facility];  Service: Endoscopy;  Laterality: N/A;  10:30am   POLYPECTOMY  04/08/2020   Procedure: POLYPECTOMY;  Surgeon: Corbin Ade, MD;  [facility];  Service: Endoscopy;;   TOTAL KNEE ARTHROPLASTY Right 05/21/2018   Procedure: TOTAL KNEE ARTHROPLASTY-RIGHT;  Surgeon: Kennedy Bucker, MD;  Location: [facility];  Service: Orthopedics;  Laterality: Right;    ALLERGIES: Allergies  Allergen Reactions   Atorvastatin     FAMILY HISTORY: Family History  Problem Relation Age of Onset   Heart Problems Mother    COPD Mother    Asthma  Mother    Kidney failure Father    Colon cancer Neg Hx     SOCIAL HISTORY: Social History   Tobacco Use   Smoking status: Former   Smokeless tobacco: Former  Building services engineer status: Never Used  Substance Use Topics   Alcohol use: Not Currently    Comment: 2-3 beers daily not dranking in a week   Drug use: Never   Social History   Social History Narrative   Are you right handed or left handed? Right   Are you currently employed ?    What is your current occupation? retired   Do you live at home alone?   Who lives with you? Wife    What type of home do you live in: 1 story or 2 story? one    Caffiene 2 cups in AM    Objective:  Vital Signs:  BP (!) 143/83 (Cuff Size: Large)   Pulse 74   Ht 5\' 9"  (1.753 m)   Wt 245 lb (111.1 kg)   SpO2 91%   BMI 36.18 kg/m   General: General appearance: Awake and alert. No distress. Cooperative with exam.  Skin: No obvious rash or jaundice. HEENT: Atraumatic. Anicteric. Lungs: Non-labored breathing on room air  Heart: Regular Extremities: No obvious deformity.  Psych: Affect appropriate.  Neurological: Mental Status: Alert. Speech fluent. No pseudobulbar affect Cranial Nerves: CNII: No RAPD. Visual fields intact. CNIII, IV, VI: PERRL. No nystagmus. EOMI. CN V: Facial sensation intact bilaterally to fine touch. CN VII: Left facial asymmetry at rest. CN VIII: Hears finger rub well bilaterally. CN IX: No hypophonia. CN X: Palate elevates symmetrically. CN XI: Full strength shoulder shrug bilaterally. CN XII: Tongue protrusion full and midline. No atrophy or fasciculations. No significant dysarthria Motor: Tone is normal. Mild left pronator drift.  Individual muscle group testing (MRC grade out of 5):  Movement     Neck flexion 5    Neck extension 5     Right Left   Shoulder abduction 5 5-   Elbow flexion 5 5   Elbow extension 5 5   Finger abduction - FDI 5 4   Finger abduction - ADM 5 4   Finger extension 5  5-   Finger flexion 5 4+    Hip flexion 5 5   Hip extension 5 5   Hip adduction 5 5   Hip abduction 5 5   Knee extension 5 5   Knee flexion 5 5   Dorsiflexion 5 5   Plantarflexion 5 5    Reflexes:  Right Left   Bicep 2+ 2+   Tricep 2+ 2+   BrRad 2+ 2+   Knee 2+ 2+   Ankle Trace Trace    Pathological Reflexes: Babinski: flexor response bilaterally Hoffman: absent bilaterally Troemner: absent bilaterally Sensation: Pinprick: intact in all extremities. No neglect. Coordination: Intact finger-to- nose-finger bilaterally. Mild difficulty with LUE Gait: Walks with walker. Without  walker, unsteady.  Labs and Imaging review: Internal labs: TSH (05/30/23) wnl Lipid panel (05/29/23): tChol 221, LDL 149, TG 138 HbA1c (05/28/23): 6.5  Imaging/Procedures: MRI brain and MRA head contrast (05/28/23): IMPRESSION: MRI HEAD:   1. 8 mm acute infarct involving the posterior limb of the right internal capsule. 2. Mild chronic microvascular ischemic changes.   MRA HEAD:   1. No large vessel occlusion, high-grade stenosis, or aneurysm.  Carotid ultrasound (05/29/23): IMPRESSION: Color duplex indicates minimal heterogeneous and calcified plaque, with no hemodynamically significant stenosis by duplex criteria in the extracranial cerebrovascular circulation.  Echocardiogram (05/29/23): 1. No evidence of LV thrombus by Definity. Left ventricular ejection  fraction, by estimation, is 70 to 75%. The left ventricle has hyperdynamic  function. The left ventricle has no regional wall motion abnormalities.  There is mild left ventricular  hypertrophy. Left ventricular diastolic parameters are consistent with  Grade I diastolic dysfunction (impaired relaxation).   2. Right ventricular systolic function is normal. The right ventricular  size is normal. Tricuspid regurgitation signal is inadequate for assessing  PA pressure.   3. The mitral valve is abnormal. No evidence of mitral valve   regurgitation. No evidence of mitral stenosis. Moderate mitral annular  calcification.   4. The aortic valve is tricuspid. Aortic valve regurgitation is not  visualized. No aortic stenosis is present.   5. Aortic dilatation noted. There is mild dilatation of the aortic root,  measuring 41 mm.   6. Increased flow velocities may be secondary to anemia, thyrotoxicosis,  hyperdynamic or high flow state.  LA normal in size.   Assessment/Plan:  Joseph Peterson is a 73 y.o. male who presents for evaluation of left sided weakness due to acute infarct (05/28/23). He has a relevant medical history of HTN, HLD, OSA on CPAP, DM, COPD, former smoker. His neurological examination is pertinent for mild left face droop and left arm and leg weakness. Available diagnostic data is significant for MRI brain showing acute infarct in right posterior limb of internal capsule. MRA showed no significant stenosis or large vessel occlusion. The etiology of patient's stroke is likely small vessel disease due to HTN, HLD, and newly diagnosed DM. He apparently cannot take a statin but his LDL was 149 on fenofibrate alone, so he likely needs something else for this. He will be finishing up 21 days of DAPT and is currently doing PT with improvement in strength.  PLAN: -Continue DAPT with aspirin 81 mg daily and plavix 75 mg daily for 21 days (~06/19/23) then stop plavix and continue only aspirin 81 mg daily -Patient to speak to PCP about cholesterol and options for HLD control, with LDL goal < 70 -Patient to speak to PCP about new diagnosis of DM -Discussed cutting back and stopping cigar use and EtOH -Stroke warning signs discussed  -Return to clinic in 6 months  The impression above as well as the plan as outlined below were extensively discussed with the patient (in the company of wife) who voiced understanding. All questions were answered to their satisfaction.  The patient was counseled on pertinent fall precautions  per the printed material provided today, and as noted under the "Patient Instructions" section below.  When available, results of the above investigations and possible further recommendations will be communicated to the patient via telephone/MyChart. Patient to call office if not contacted after expected testing turnaround time.   Total time spent reviewing records, interview, history/exam, documentation, and coordination of care on day of encounter:  60 min  Thank you for allowing me to participate in patient's care.  If I can answer any additional questions, I would be pleased to do so.  Jacquelyne Balint, MD   CC: Carylon Perches, MD [address] [place] [address]  CC: Referring provider: Kendell Bane, MD [address] [place],  [place] [address]

## 2023-06-06 ENCOUNTER — Ambulatory Visit (HOSPITAL_COMMUNITY): Attending: Internal Medicine

## 2023-06-06 ENCOUNTER — Other Ambulatory Visit: Payer: Self-pay

## 2023-06-06 DIAGNOSIS — R262 Difficulty in walking, not elsewhere classified: Secondary | ICD-10-CM | POA: Insufficient documentation

## 2023-06-06 DIAGNOSIS — R531 Weakness: Secondary | ICD-10-CM | POA: Insufficient documentation

## 2023-06-06 NOTE — Therapy (Signed)
 OUTPATIENT PHYSICAL THERAPY NEURO EVALUATION   Patient Name: Joseph Peterson MRN: [id number] DOB:08-18-1950, 73 y.o., male Today's Date: 06/06/2023   PCP: Carylon Perches, MD REFERRING PROVIDER: Carylon Perches, MD  END OF SESSION:  PT End of Session - 06/06/23 1418     Visit Number 1    Number of Visits 8    Date for PT Re-Evaluation 07/04/23    Authorization Type [employer] medicare    PT Start Time 0230    PT Stop Time 0307    PT Time Calculation (min) 37 min    Activity Tolerance Patient tolerated treatment well    Behavior During Therapy [facility] for tasks assessed/performed             Past Medical History:  Diagnosis Date   COPD (chronic obstructive pulmonary disease) (HCC)    Hypercholesteremia    Hypertension    S/P TKR (total knee replacement) using cement, right 05/21/2018   Sleep apnea    Past Surgical History:  Procedure Laterality Date   COLONOSCOPY N/A 05/26/2013   Procedure: COLONOSCOPY;  Surgeon: Corbin Ade, MD;  [facility];  Service: Endoscopy;  Laterality: N/A;  1:00 PM-moved to [address] notified pt   COLONOSCOPY WITH PROPOFOL N/A 04/08/2020   Procedure: COLONOSCOPY WITH PROPOFOL;  Surgeon: Corbin Ade, MD;  [facility];  Service: Endoscopy;  Laterality: N/A;  10:30am   POLYPECTOMY  04/08/2020   Procedure: POLYPECTOMY;  Surgeon: Corbin Ade, MD;  [facility];  Service: Endoscopy;;   TOTAL KNEE ARTHROPLASTY Right 05/21/2018   Procedure: TOTAL KNEE ARTHROPLASTY-RIGHT;  Surgeon: Kennedy Bucker, MD;  Location: [facility];  Service: Orthopedics;  Laterality: Right;   Patient Active Problem List   Diagnosis Date Noted   Obesity, Class II, BMI 35-39.9 05/29/2023   Acute ischemic stroke (HCC) 05/28/2023   Essential hypertension    COPD (chronic obstructive pulmonary disease) (HCC)    Sleep apnea    History of adenomatous polyp of colon 02/17/2020    ONSET DATE: 05/25/23 last known well date  REFERRING DIAG: stroke, lt  side weakness  THERAPY DIAG:  Left-sided weakness - Plan: PT plan of care cert/re-cert  Difficulty in walking, not elsewhere classified - Plan: PT plan of care cert/re-cert  Rationale for Evaluation and Treatment: Rehabilitation  SUBJECTIVE:                                                                                                                                                                                             SUBJECTIVE STATEMENT: Went to bed 2/28 without issue; woke up that  sat morning 2/29 left side weakness.  That Monday morning could not get out of chair so went to Urgent Care who sent to ED.  Now referred to outpatient therapy  Pt accompanied by:  wife  PERTINENT HISTORY: R TKA 2020  PAIN:  Are you having pain? Yes: NPRS scale: 0-7/10 Pain location: left hip Pain description: right now not hurting Aggravating factors: prolonged walking Relieving factors: rest  PRECAUTIONS: Fall     WEIGHT BEARING RESTRICTIONS: No  FALLS: Has patient fallen in last 6 months? No  LIVING ENVIRONMENT: Lives with: lives with their spouse Lives in: House/apartment Stairs: Yes: External: 4 steps; on right going up, on left going up, and can reach both Has following equipment at home: Single point cane, Walker - 2 wheeled, Walker - 4 wheeled, and bed side commode  PLOF: Independent  currently hard time with buttons; hard time pulling up his pants with left hand  PATIENT GOALS: get back to normal  OBJECTIVE:  Note: Objective measures were completed at Evaluation unless otherwise noted.  DIAGNOSTIC FINDINGS:   COGNITION: Overall cognitive status: Within functional limits for tasks assessed   SENSATION: WFL  COORDINATION: Wfl lower extremity  EDEMA:  None noted  MUSCLE TONE:    POSTURE: rounded shoulders, forward head, and flexed trunk   LOWER EXTREMITY ROM:     Active  Right Eval Left Eval  Hip flexion    Hip extension    Hip abduction    Hip  adduction    Hip internal rotation    Hip external rotation    Knee flexion    Knee extension    Ankle dorsiflexion    Ankle plantarflexion    Ankle inversion    Ankle eversion     (Blank rows = not tested)  LOWER EXTREMITY MMT:    MMT Right Eval Left Eval  Hip flexion 5 4+  Hip extension 3 2  Hip abduction    Hip adduction    Hip internal rotation    Hip external rotation    Knee flexion 4+ 3-  Knee extension 5 4+  Ankle dorsiflexion 5 4  Ankle plantarflexion    Ankle inversion    Ankle eversion    Decreased grip strength noted left versus right  (Blank rows = not tested)  BED MOBILITY: takes extra time and effort to perform Sit to supine Modified independence Supine to sit Modified independence Rolling to Right Modified independence Rolling to Left Modified independence  TRANSFERS: Assistive device utilized: Environmental consultant - 4 wheeled  Sit to stand: Modified independence Stand to sit: Modified independence Chair to chair: Modified independence Floor:  not tested   STAIRS: Next visit  GAIT: Gait pattern: decreased step length- Left, decreased stance time- Left, and decreased ankle dorsiflexion- Left Distance walked: 331 ft with rollator Assistive device utilized: Environmental consultant - 4 wheeled Level of assistance: Modified independence Comments: tends walk with forward flexed trunk; "foot slap" left foot  FUNCTIONAL TESTS:  5 times sit to stand: 14.69 sec not UE assist 2 minute walk test: 331 ft with rollator SLS right 3 sec; left unable  PATIENT SURVEYS:  LEFS 31/80 38.8 %  TREATMENT DATE: 06/06/23 physical therapy evaluation and HEP instruction    PATIENT EDUCATION: Education details: Patient educated on exam findings, POC, scope of PT, HEP, and what to expect next visit. Person educated: Patient Education method: Explanation, Demonstration,  and Handouts Education comprehension: verbalized understanding, returned demonstration, verbal cues required, and tactile cues required HOME EXERCISE PROGRAM: Access Code: [email or web address] URL: [email or web address] Date: 06/06/2023 Prepared by: [facility]  Exercises - Sit to Stand with Armchair  - 2 x daily - 7 x weekly - 1 sets - 10 reps - Supine Bridge  - 2 x daily - 7 x weekly - 1 sets - 10 reps - Putty Squeezes  - 2 x daily - 7 x weekly - 1 sets - 50 reps  GOALS: Goals reviewed with patient? No  SHORT TERM GOALS: Target date: 06/20/2023  patient will be independent with initial HEP  Baseline: Goal status: INITIAL  2.  Patient will report 50% improvement overall  Baseline:  Goal status: INITIAL   LONG TERM GOALS: Target date: 07/04/2023  Patient will be independent in self management strategies to improve quality of life and functional outcomes.  Baseline:  Goal status: INITIAL  2.  Patient will report 75% improvement overall  Baseline:  Goal status: INITIAL  3.  Patient will  improve LEFS score by 9  points to demonstrate improved perceived function  Baseline: 31/80 Goal status: INITIAL  4.  Patient will be able to stand SLS on left leg 4" to demonstrate improved functional balance Baseline: unable Goal status: INITIAL  5.  Patient will increase distance on to 375 ft  to demonstrate improved functional mobility walking household and community distances.    Baseline: 331 ft with rollator Goal status: INITIAL   ASSESSMENT:  CLINICAL IMPRESSION: Patient is a 73 y.o. male who was seen today for physical therapy evaluation and treatment for stroke, lt side weakness. Patient demonstrates decreased strength, balance deficits and gait abnormalities which are negatively impacting patient ability to perform ADLs and functional mobility tasks. Patient will benefit from skilled physical therapy services to address these deficits to improve level of  function with ADLs, functional mobility tasks, and reduce risk for falls.    OBJECTIVE IMPAIRMENTS: Abnormal gait, decreased activity tolerance, decreased balance, difficulty walking, decreased ROM, decreased strength, hypomobility, increased fascial restrictions, impaired perceived functional ability, and pain.   ACTIVITY LIMITATIONS: carrying, lifting, bending, standing, squatting, stairs, transfers, bed mobility, bathing, locomotion level, and caring for others  PARTICIPATION LIMITATIONS: meal prep, cleaning, shopping, community activity, and yard work  [facility] POTENTIAL: Good  CLINICAL DECISION MAKING: Evolving/moderate complexity  EVALUATION COMPLEXITY: Moderate  PLAN:  PT FREQUENCY: 2x/week  PT DURATION: 4 weeks  PLANNED INTERVENTIONS: 97164- PT Re-evaluation, 97110-Therapeutic exercises, 97530- Therapeutic activity, 97112- Neuromuscular re-education, 97535- Self Care, 69629- Manual therapy, [id number]- Gait training, [phone]- Orthotic Fit/training, [phone]- Canalith repositioning, [id number]- Aquatic Therapy, [phone]- Splinting, Patient/Family education, Balance training, Stair training, Taping, Dry Needling, Joint mobilization, Joint manipulation, Spinal manipulation, Spinal mobilization, Scar mobilization, and DME instructions.   PLAN FOR NEXT SESSION: Review HEP and goals; hip extension and hamstring strengthening, gait training, balance; lower extremity strengthening; would benefit from OT for left upper extremity and discussed this with patient and wife.   3:08 PM, 06/06/23 Ashtan Laton Small Vollie Aaron MPT [place] [facility] [place] [phone] Ph:[phone]

## 2023-06-07 ENCOUNTER — Encounter: Payer: Self-pay | Admitting: Neurology

## 2023-06-07 ENCOUNTER — Ambulatory Visit: Admitting: Neurology

## 2023-06-07 VITALS — BP 143/83 | HR 74 | Ht 69.0 in | Wt 245.0 lb

## 2023-06-07 DIAGNOSIS — I10 Essential (primary) hypertension: Secondary | ICD-10-CM

## 2023-06-07 DIAGNOSIS — R2689 Other abnormalities of gait and mobility: Secondary | ICD-10-CM

## 2023-06-07 DIAGNOSIS — I6381 Other cerebral infarction due to occlusion or stenosis of small artery: Secondary | ICD-10-CM | POA: Diagnosis not present

## 2023-06-07 DIAGNOSIS — E785 Hyperlipidemia, unspecified: Secondary | ICD-10-CM

## 2023-06-07 DIAGNOSIS — G4733 Obstructive sleep apnea (adult) (pediatric): Secondary | ICD-10-CM

## 2023-06-07 DIAGNOSIS — E1169 Type 2 diabetes mellitus with other specified complication: Secondary | ICD-10-CM

## 2023-06-07 NOTE — Patient Instructions (Signed)
 I saw you today for your recent stroke.  We need to do the following the try to prevent another stroke: Continue aspirin 81 mg and Plavix 75 mg daily for 21 total days. This should end around 06/19/23. At that time when you run out of Plavix, stop Plavix and continue only aspirin 81 mg every day. You need to speak to your primary care doctor about options for treating your cholesterol. It is still high despite fenofibrate. You apparently may not be able to take statins. There is still a lot of room for improvement in your cholesterol though. You should also speak to your primary care doctor about your sugar number (hemoglobin A1c) as it is now in the diabetic range. Cut back and stop smoking cigars. I would also cut back on drinking alcohol.  If you have new difficulty speaking, face droop, numbness on one side of the body, weakness on one side of the body, or dizziness/imbalance, this could be the sign of a stroke. Don't wait, please call EMS and be evaluated at the nearest emergency room.   The physicians and staff at [facility] are committed to providing excellent care. You may receive a survey requesting feedback about your experience at our office. We strive to receive "very good" responses to the survey questions. If you feel that your experience would prevent you from giving the office a "very good " response, please contact our office to try to remedy the situation. We may be reached at [phone]. Thank you for taking the time out of your busy day to complete the survey.  Jacquelyne Balint, MD [facility]  Preventing Falls at [facility] are common, often dreaded events in the lives of older people. Aside from the obvious injuries and even death that may result, fall can cause wide-ranging consequences including loss of independence, mental decline, decreased activity and mobility. Younger people are also at risk of falling, especially those with chronic illnesses and  fatigue.  Ways to reduce risk for falling Examine diet and medications. Warm foods and alcohol dilate blood vessels, which can lead to dizziness when standing. Sleep aids, antidepressants and pain medications can also increase the likelihood of a fall.  Get a vision exam. Poor vision, cataracts and glaucoma increase the chances of falling.  Check foot gear. Shoes should fit snugly and have a sturdy, nonskid sole and a broad, low heel  Participate in a physician-approved exercise program to build and maintain muscle strength and improve balance and coordination. Programs that use ankle weights or stretch bands are excellent for muscle-strengthening. Water aerobics programs and low-impact Tai Chi programs have also been shown to improve balance and coordination.  Increase vitamin D intake. Vitamin D improves muscle strength and increases the amount of calcium the body is able to absorb and deposit in bones.  How to prevent falls from common hazards Floors - Remove all loose wires, cords, and throw rugs. Minimize clutter. Make sure rugs are anchored and smooth. Keep furniture in its usual place.  Chairs -- Use chairs with straight backs, armrests and firm seats. Add firm cushions to existing pieces to add height.  Bathroom - Install grab bars and non-skid tape in the tub or shower. Use a bathtub transfer bench or a shower chair with a back support Use an elevated toilet seat and/or safety rails to assist standing from a low surface. Do not use towel racks or bathroom tissue holders to help you stand.  Lighting - Make sure halls, stairways,  and entrances are well-lit. Install a night light in your bathroom or hallway. Make sure there is a light switch at the top and bottom of the staircase. Turn lights on if you get up in the middle of the night. Make sure lamps or light switches are within reach of the bed if you have to get up during the night.  Kitchen - Install non-skid rubber mats near the  sink and stove. Clean spills immediately. Store frequently used utensils, pots, pans between waist and eye level. This helps prevent reaching and bending. Sit when getting things out of lower cupboards.  Living room/ Bedrooms - Place furniture with wide spaces in between, giving enough room to move around. Establish a route through the living room that gives you something to hold onto as you walk.  Stairs - Make sure treads, rails, and rugs are secure. Install a rail on both sides of the stairs. If stairs are a threat, it might be helpful to arrange most of your activities on the lower level to reduce the number of times you must climb the stairs.  Entrances and doorways - Install metal handles on the walls adjacent to the doorknobs of all doors to make it more secure as you travel through the doorway.  Tips for maintaining balance Keep at least one hand free at all times. Try using a backpack or fanny pack to hold things rather than carrying them in your hands. Never carry objects in both hands when walking as this interferes with keeping your balance.  Attempt to swing both arms from front to back while walking. This might require a conscious effort if Parkinson's disease has diminished your movement. It will, however, help you to maintain balance and posture, and reduce fatigue.  Consciously lift your feet off of the ground when walking. Shuffling and dragging of the feet is a common culprit in losing your balance.  When trying to navigate turns, use a "U" technique of facing forward and making a wide turn, rather than pivoting sharply.  Try to stand with your feet shoulder-length apart. When your feet are close together for any length of time, you increase your risk of losing your balance and falling.  Do one thing at a time. Don't try to walk and accomplish another task, such as reading or looking around. The decrease in your automatic reflexes complicates motor function, so the less  distraction, the better.  Do not wear rubber or gripping soled shoes, they might "catch" on the floor and cause tripping.  Move slowly when changing positions. Use deliberate, concentrated movements and, if needed, use a grab bar or walking aid. Count 15 seconds between each movement. For example, when rising from a seated position, wait 15 seconds after standing to begin walking.  If balance is a continuous problem, you might want to consider a walking aid such as a cane, walking stick, or walker. Once you've mastered walking with help, you might be ready to try it on your own again.

## 2023-06-11 ENCOUNTER — Ambulatory Visit (HOSPITAL_COMMUNITY): Admitting: Physical Therapy

## 2023-06-11 DIAGNOSIS — R531 Weakness: Secondary | ICD-10-CM

## 2023-06-11 DIAGNOSIS — R262 Difficulty in walking, not elsewhere classified: Secondary | ICD-10-CM

## 2023-06-11 NOTE — Therapy (Signed)
 OUTPATIENT PHYSICAL THERAPY NEURO EVALUATION   Patient Name: Joseph Peterson MRN: [id number] DOB:April 06, 1950, 73 y.o., male Today's Date: 06/11/2023   PCP: Carylon Perches, MD REFERRING PROVIDER: Carylon Perches, MD  END OF SESSION:  PT End of Session - 06/11/23 1354     Visit Number 2    Number of Visits 8    Date for PT Re-Evaluation 07/04/23    Authorization Type [employer] medicare    PT Start Time 1350    PT Stop Time 1433    PT Time Calculation (min) 43 min    Activity Tolerance Patient tolerated treatment well    Behavior During Therapy [facility] for tasks assessed/performed             Past Medical History:  Diagnosis Date   COPD (chronic obstructive pulmonary disease) (HCC)    Hypercholesteremia    Hypertension    S/P TKR (total knee replacement) using cement, right 05/21/2018   Sleep apnea    Past Surgical History:  Procedure Laterality Date   COLONOSCOPY N/A 05/26/2013   Procedure: COLONOSCOPY;  Surgeon: Corbin Ade, MD;  Location: [facility];  Service: Endoscopy;  Laterality: N/A;  1:00 PM-moved to [address] notified pt   COLONOSCOPY WITH PROPOFOL N/A 04/08/2020   Procedure: COLONOSCOPY WITH PROPOFOL;  Surgeon: Corbin Ade, MD;  Location: [facility];  Service: Endoscopy;  Laterality: N/A;  10:30am   POLYPECTOMY  04/08/2020   Procedure: POLYPECTOMY;  Surgeon: Corbin Ade, MD;  Location: [facility];  Service: Endoscopy;;   TOTAL KNEE ARTHROPLASTY Right 05/21/2018   Procedure: TOTAL KNEE ARTHROPLASTY-RIGHT;  Surgeon: Kennedy Bucker, MD;  Location: [facility] ORS;  Service: Orthopedics;  Laterality: Right;   Patient Active Problem List   Diagnosis Date Noted   Obesity, Class II, BMI 35-39.9 05/29/2023   Acute ischemic stroke (HCC) 05/28/2023   Essential hypertension    COPD (chronic obstructive pulmonary disease) (HCC)    Sleep apnea    History of adenomatous polyp of colon 02/17/2020    ONSET DATE: 05/25/23 last known well date  REFERRING DIAG: stroke, lt  side weakness  THERAPY DIAG:  Left-sided weakness  Difficulty in walking, not elsewhere classified  Rationale for Evaluation and Treatment: Rehabilitation  SUBJECTIVE:                                                                                                                                                                                             SUBJECTIVE STATEMENT: Went to bed 2/28 without issue; woke up that sat morning 2/29 left side weakness.  That Monday morning could not get out  of chair so went to Urgent Care who sent to ED.  Now referred to outpatient therapy  Pt accompanied by:  wife  PERTINENT HISTORY: R TKA 2020  PAIN:  Are you having pain? Yes: NPRS scale: 0-7/10 Pain location: left hip Pain description: right now not hurting Aggravating factors: prolonged walking Relieving factors: rest  PRECAUTIONS: Fall     WEIGHT BEARING RESTRICTIONS: No  FALLS: Has patient fallen in last 6 months? No  LIVING ENVIRONMENT: Lives with: lives with their spouse Lives in: House/apartment Stairs: Yes: External: 4 steps; on right going up, on left going up, and can reach both Has following equipment at home: Single point cane, Walker - 2 wheeled, Walker - 4 wheeled, and bed side commode  PLOF: Independent  currently hard time with buttons; hard time pulling up his pants with left hand  PATIENT GOALS: get back to normal  OBJECTIVE:  Note: Objective measures were completed at Evaluation unless otherwise noted.  DIAGNOSTIC FINDINGS:   COGNITION: Overall cognitive status: Within functional limits for tasks assessed   SENSATION: WFL  COORDINATION: Wfl lower extremity  EDEMA:  None noted  MUSCLE TONE:    POSTURE: rounded shoulders, forward head, and flexed trunk   LOWER EXTREMITY ROM:     Active  Right Eval Left Eval  Hip flexion    Hip extension    Hip abduction    Hip adduction    Hip internal rotation    Hip external rotation    Knee flexion     Knee extension    Ankle dorsiflexion    Ankle plantarflexion    Ankle inversion    Ankle eversion     (Blank rows = not tested)  LOWER EXTREMITY MMT:    MMT Right Eval Left Eval  Hip flexion 5 4+  Hip extension 3 2  Hip abduction    Hip adduction    Hip internal rotation    Hip external rotation    Knee flexion 4+ 3-  Knee extension 5 4+  Ankle dorsiflexion 5 4  Ankle plantarflexion    Ankle inversion    Ankle eversion    Decreased grip strength noted left versus right  (Blank rows = not tested)  BED MOBILITY: takes extra time and effort to perform Sit to supine Modified independence Supine to sit Modified independence Rolling to Right Modified independence Rolling to Left Modified independence  TRANSFERS: Assistive device utilized: Environmental consultant - 4 wheeled  Sit to stand: Modified independence Stand to sit: Modified independence Chair to chair: Modified independence Floor:  not tested   STAIRS: Next visit 06/11/23:  step to with bil HR's, 4" step height  GAIT: Gait pattern: decreased step length- Left, decreased stance time- Left, and decreased ankle dorsiflexion- Left Distance walked: 331 ft with rollator Assistive device utilized: Environmental consultant - 4 wheeled Level of assistance: Modified independence Comments: tends walk with forward flexed trunk; "foot slap" left foot  FUNCTIONAL TESTS:  5 times sit to stand: 14.69 sec not UE assist 2 minute walk test: 331 ft with rollator SLS right 3 sec; left unable  PATIENT SURVEYS:  LEFS 31/80 38.8 %  TREATMENT DATE:  06/11/23 Sit to stands no UE 10X Standing:  heelrasies 0X  Toeraises on decline 20X  Hip abductions 2X10  Hip extensions 2X10   Lunges onto 4" step  4" lateral step ups 2X10 each with 1 UE assist  Forward step ups 4" 2X10 each with 1 UE assist Gait with SPC 100 feet step through but  with antalgia due to Lt hip pain Stairs 4" with bil HR step to up and down 1RT Nustep seat 10 UE/LE level 4 5 minutes [place]    06/06/23 physical therapy evaluation and HEP instruction    PATIENT EDUCATION: Education details: Patient educated on exam findings, POC, scope of PT, HEP, and what to expect next visit. Person educated: Patient Education method: Explanation, Demonstration, and Handouts Education comprehension: verbalized understanding, returned demonstration, verbal cues required, and tactile cues required HOME EXERCISE PROGRAM: Access Code: [email or web address] URL: [email or web address] Date: 06/06/2023 Prepared by: [facility]  Exercises - Sit to Stand with Armchair  - 2 x daily - 7 x weekly - 1 sets - 10 reps - Supine Bridge  - 2 x daily - 7 x weekly - 1 sets - 10 reps - Putty Squeezes  - 2 x daily - 7 x weekly - 1 sets - 50 reps  GOALS: Goals reviewed with patient? No  SHORT TERM GOALS: Target date: 06/20/2023  patient will be independent with initial HEP  Baseline: Goal status: INITIAL  2.  Patient will report 50% improvement overall  Baseline:  Goal status: INITIAL   LONG TERM GOALS: Target date: 07/04/2023  Patient will be independent in self management strategies to improve quality of life and functional outcomes.  Baseline:  Goal status: INITIAL  2.  Patient will report 75% improvement overall  Baseline:  Goal status: INITIAL  3.  Patient will  improve LEFS score by 9  points to demonstrate improved perceived function  Baseline: 31/80 Goal status: INITIAL  4.  Patient will be able to stand SLS on left leg 4" to demonstrate improved functional balance Baseline: unable Goal status: INITIAL  5.  Patient will increase distance on to 375 ft  to demonstrate improved functional mobility walking household and community distances.    Baseline: 331 ft with rollator Goal status: INITIAL   ASSESSMENT:  CLINICAL IMPRESSION: Reviewed  goals and POC moving forward.  Progressed with standing strengthening and functional activities.  Pt able to complete all exericses with cues for upright posturing and general form.  Pt also tends to complete therex too quickly requiring cues to complete more controlled.  Frequent rest breaks required due to fatigue.  Gait with SPC revealed good strength and ability, however antalgic due to Lt hip pain that reports he had prior to CVA. Pt able to negotiate stairs with step to pattern using bil handrails.  Pt will continue to benefit from skilled therapy to progress towards goals.    OBJECTIVE IMPAIRMENTS: Abnormal gait, decreased activity tolerance, decreased balance, difficulty walking, decreased ROM, decreased strength, hypomobility, increased fascial restrictions, impaired perceived functional ability, and pain.   ACTIVITY LIMITATIONS: carrying, lifting, bending, standing, squatting, stairs, transfers, bed mobility, bathing, locomotion level, and caring for others  PARTICIPATION LIMITATIONS: meal prep, cleaning, shopping, community activity, and yard work  [facility] POTENTIAL: Good  CLINICAL DECISION MAKING: Evolving/moderate complexity  EVALUATION COMPLEXITY: Moderate  PLAN:  PT FREQUENCY: 2x/week  PT DURATION: 4 weeks  PLANNED INTERVENTIONS: 97164- PT Re-evaluation, 97110-Therapeutic exercises, 97530- Therapeutic activity, [id number]- Neuromuscular re-education, 97535- Self Care,  16109- Manual therapy, [id number]- Gait training, 60454- Orthotic Fit/training, 09811- Canalith repositioning, 91478- Aquatic Therapy, [phone]- Splinting, Patient/Family education, Balance training, Stair training, Taping, Dry Needling, Joint mobilization, Joint manipulation, Spinal manipulation, Spinal mobilization, Scar mobilization, and DME instructions.   PLAN FOR NEXT SESSION: Next session add hamstring flexion, balance challenges and continued LE strengthening.  Continue to work on gait with LRAD.  Pt to speak to MD  Friday regarding OT evaluation for left upper extremity.  Update HEP next session also.     1:54 PM, 06/11/23 Lurena Nida, PTA/CLT [facility] Ph: [phone]

## 2023-06-12 ENCOUNTER — Ambulatory Visit (HOSPITAL_COMMUNITY)

## 2023-06-12 ENCOUNTER — Encounter (HOSPITAL_COMMUNITY): Payer: Self-pay

## 2023-06-12 DIAGNOSIS — R531 Weakness: Secondary | ICD-10-CM | POA: Diagnosis not present

## 2023-06-12 DIAGNOSIS — R262 Difficulty in walking, not elsewhere classified: Secondary | ICD-10-CM

## 2023-06-12 NOTE — Therapy (Signed)
 OUTPATIENT PHYSICAL THERAPY NEURO TREATMENT   Patient Name: Joseph Peterson MRN: [id number] DOB:12/19/1950, 73 y.o., male Today's Date: 06/12/2023   PCP: Carylon Perches, MD REFERRING PROVIDER: Carylon Perches, MD  END OF SESSION:  PT End of Session - 06/12/23 1339     Visit Number 3    Number of Visits 8    Date for PT Re-Evaluation 07/04/23    Authorization Type [employer] medicare    Progress Note Due on Visit 8    PT Start Time 1340    PT Stop Time 1428    PT Time Calculation (min) 48 min    Equipment Utilized During Treatment Gait belt    Activity Tolerance Patient tolerated treatment well    Behavior During Therapy WFL for tasks assessed/performed             Past Medical History:  Diagnosis Date   COPD (chronic obstructive pulmonary disease) (HCC)    Hypercholesteremia    Hypertension    S/P TKR (total knee replacement) using cement, right 05/21/2018   Sleep apnea    Past Surgical History:  Procedure Laterality Date   COLONOSCOPY N/A 05/26/2013   Procedure: COLONOSCOPY;  Surgeon: Corbin Ade, MD;  [facility];  Service: Endoscopy;  Laterality: N/A;  1:00 PM-moved to [address] notified pt   COLONOSCOPY WITH PROPOFOL N/A 04/08/2020   Procedure: COLONOSCOPY WITH PROPOFOL;  Surgeon: Corbin Ade, MD;  [facility];  Service: Endoscopy;  Laterality: N/A;  10:30am   POLYPECTOMY  04/08/2020   Procedure: POLYPECTOMY;  Surgeon: Corbin Ade, MD;  [facility];  Service: Endoscopy;;   TOTAL KNEE ARTHROPLASTY Right 05/21/2018   Procedure: TOTAL KNEE ARTHROPLASTY-RIGHT;  Surgeon: Kennedy Bucker, MD;  Location: [facility];  Service: Orthopedics;  Laterality: Right;   Patient Active Problem List   Diagnosis Date Noted   Obesity, Class II, BMI 35-39.9 05/29/2023   Acute ischemic stroke (HCC) 05/28/2023   Essential hypertension    COPD (chronic obstructive pulmonary disease) (HCC)    Sleep apnea    History of adenomatous polyp of colon  02/17/2020    ONSET DATE: 05/25/23 last known well date  REFERRING DIAG: stroke, lt side weakness  THERAPY DIAG:  Left-sided weakness  Difficulty in walking, not elsewhere classified  Rationale for Evaluation and Treatment: Rehabilitation  SUBJECTIVE:                                                                                                                                                                                             SUBJECTIVE STATEMENT: 06/12/23:  Pt with wife  arrived with wooden cane he made in the past.  No reports of pain or recent falls.    Eval:  Went to bed 2/28 without issue; woke up that sat morning 2/29 left side weakness.  That Monday morning could not get out of chair so went to Urgent Care who sent to ED.  Now referred to outpatient therapy  Pt accompanied by:  wife  PERTINENT HISTORY: R TKA 2020  PAIN:  Are you having pain? Yes: NPRS scale: 0/10 Pain location: left hip Pain description: right now not hurting Aggravating factors: prolonged walking Relieving factors: rest  PRECAUTIONS: Fall     WEIGHT BEARING RESTRICTIONS: No  FALLS: Has patient fallen in last 6 months? No  LIVING ENVIRONMENT: Lives with: lives with their spouse Lives in: House/apartment Stairs: Yes: External: 4 steps; on right going up, on left going up, and can reach both Has following equipment at home: Single point cane, Walker - 2 wheeled, Walker - 4 wheeled, and bed side commode  PLOF: Independent  currently hard time with buttons; hard time pulling up his pants with left hand  PATIENT GOALS: get back to normal  OBJECTIVE:  Note: Objective measures were completed at Evaluation unless otherwise noted.  DIAGNOSTIC FINDINGS:   COGNITION: Overall cognitive status: Within functional limits for tasks assessed   SENSATION: WFL  COORDINATION: Wfl lower extremity  EDEMA:  None noted  MUSCLE TONE:    POSTURE: rounded shoulders, forward head, and flexed  trunk   LOWER EXTREMITY ROM:     Active  Right Eval Left Eval  Hip flexion    Hip extension    Hip abduction    Hip adduction    Hip internal rotation    Hip external rotation    Knee flexion    Knee extension    Ankle dorsiflexion    Ankle plantarflexion    Ankle inversion    Ankle eversion     (Blank rows = not tested)  LOWER EXTREMITY MMT:    MMT Right Eval Left Eval  Hip flexion 5 4+  Hip extension 3 2  Hip abduction    Hip adduction    Hip internal rotation    Hip external rotation    Knee flexion 4+ 3-  Knee extension 5 4+  Ankle dorsiflexion 5 4  Ankle plantarflexion    Ankle inversion    Ankle eversion    Decreased grip strength noted left versus right  (Blank rows = not tested)  BED MOBILITY: takes extra time and effort to perform Sit to supine Modified independence Supine to sit Modified independence Rolling to Right Modified independence Rolling to Left Modified independence  TRANSFERS: Assistive device utilized: Environmental consultant - 4 wheeled  Sit to stand: Modified independence Stand to sit: Modified independence Chair to chair: Modified independence Floor:  not tested   STAIRS: Next visit 06/11/23:  step to with bil HR's, 4" step height  GAIT: Gait pattern: decreased step length- Left, decreased stance time- Left, and decreased ankle dorsiflexion- Left Distance walked: 331 ft with rollator Assistive device utilized: Environmental consultant - 4 wheeled Level of assistance: Modified independence Comments: tends walk with forward flexed trunk; "foot slap" left foot  FUNCTIONAL TESTS:  5 times sit to stand: 14.69 sec not UE assist 2 minute walk test: 331 ft with rollator SLS right 3 sec; left unable  PATIENT SURVEYS:  LEFS 31/80 38.8 %  TREATMENT DATE:  06/12/23: Gait training with SPC (advised to use a cane with solid base)  270ft Seated: -STS 2x 10 -facing treadmill in chair hamstring curls 3 sets x 1 ' Standing:  Squats 2sets 8 reps (front of chair with cueing for mechanics)  Heel raises on incline slope 2x 10  Toe raises on decline slope 2x 10  Sidestep with GTB around thigh 4RT inside // bars  Hamstring curls 2x 8 reps 3" holds BLE  Wall arch 8x 3"  Nustep [place] 5' SPM >65   06/11/23 Sit to stands no UE 10X Standing:  heelrasies 0X  Toeraises on decline 20X  Hip abductions 2X10  Hip extensions 2X10   Lunges onto 4" step  4" lateral step ups 2X10 each with 1 UE assist  Forward step ups 4" 2X10 each with 1 UE assist Gait with SPC 100 feet step through but with antalgia due to Lt hip pain Stairs 4" with bil HR step to up and down 1RT Nustep seat 10 UE/LE level 4 5 minutes [place]    06/06/23 physical therapy evaluation and HEP instruction    PATIENT EDUCATION: Education details: Patient educated on exam findings, POC, scope of PT, HEP, and what to expect next visit. Person educated: Patient Education method: Explanation, Demonstration, and Handouts Education comprehension: verbalized understanding, returned demonstration, verbal cues required, and tactile cues required HOME EXERCISE PROGRAM: Access Code: [email or web address] URL: [email or web address] Date: 06/06/2023 Prepared by: [facility]  Exercises - Sit to Stand with Armchair  - 2 x daily - 7 x weekly - 1 sets - 10 reps - Supine Bridge  - 2 x daily - 7 x weekly - 1 sets - 10 reps - Putty Squeezes  - 2 x daily - 7 x weekly - 1 sets - 50 reps  - Squat with Chair and Counter Support  - 2 x daily - 7 x weekly - 1 sets - 10 reps - Standing Hamstring Curl with Chair Support  - 1 x daily - 7 x weekly - 3 sets - 10 reps - Side Stepping with Resistance at Thighs and Counter Support  - 1 x daily - 7 x weekly - 1 sets - 5 reps  GOALS: Goals reviewed with patient? No  SHORT TERM GOALS: Target date: 06/20/2023  patient will be  independent with initial HEP  Baseline: Goal status: INITIAL  2.  Patient will report 50% improvement overall  Baseline:  Goal status: INITIAL   LONG TERM GOALS: Target date: 07/04/2023  Patient will be independent in self management strategies to improve quality of life and functional outcomes.  Baseline:  Goal status: INITIAL  2.  Patient will report 75% improvement overall  Baseline:  Goal status: INITIAL  3.  Patient will  improve LEFS score by 9  points to demonstrate improved perceived function  Baseline: 31/80 Goal status: INITIAL  4.  Patient will be able to stand SLS on left leg 4" to demonstrate improved functional balance Baseline: unable Goal status: INITIAL  5.  Patient will increase distance on to 375 ft  to demonstrate improved functional mobility walking household and community distances.    Baseline: 331 ft with rollator Goal status: INITIAL   ASSESSMENT:  CLINICAL IMPRESSION: Pt brought his homemade SPC to session, gait training complete with ability to demonstrate good sequence though cane did slip once due to uneven base, advised pt to purchase a cane with solid base.  Session focus with LE strengthening exercises  and balance activities.  Added exercises for gluteal and hamstring strengthening with improved gait noted.  Cueing for proper form and encouraged to slow down with all exercises for maximal benefits with exercise.  Pt tolerated well with no reports of pain.  Was limited by fatigue with a couple rest breaks required and towel given for sweat. Add glut strengthening exercises to HEP, encouraged to complete near counter/sink for safety.  OBJECTIVE IMPAIRMENTS: Abnormal gait, decreased activity tolerance, decreased balance, difficulty walking, decreased ROM, decreased strength, hypomobility, increased fascial restrictions, impaired perceived functional ability, and pain.   ACTIVITY LIMITATIONS: carrying, lifting, bending, standing, squatting,  stairs, transfers, bed mobility, bathing, locomotion level, and caring for others  PARTICIPATION LIMITATIONS: meal prep, cleaning, shopping, community activity, and yard work  [facility] POTENTIAL: Good  CLINICAL DECISION MAKING: Evolving/moderate complexity  EVALUATION COMPLEXITY: Moderate  PLAN:  PT FREQUENCY: 2x/week  PT DURATION: 4 weeks  PLANNED INTERVENTIONS: 97164- PT Re-evaluation, 97110-Therapeutic exercises, 97530- Therapeutic activity, 97112- Neuromuscular re-education, 97535- Self Care, 69629- Manual therapy, [id number]- Gait training, [phone]- Orthotic Fit/training, [phone]- Canalith repositioning, [id number]- Aquatic Therapy, [phone]- Splinting, Patient/Family education, Balance training, Stair training, Taping, Dry Needling, Joint mobilization, Joint manipulation, Spinal manipulation, Spinal mobilization, Scar mobilization, and DME instructions.   PLAN FOR NEXT SESSION: Next session add hamstring flexion, balance challenges and continued LE strengthening.  Continue to work on gait with LRAD.  Pt to speak to MD Friday regarding OT evaluation for left upper extremity.  Update HEP as appropriate.  Becky Sax, LPTA/CLT; [place] [phone]  4:33 PM, 06/12/23

## 2023-06-15 DIAGNOSIS — M791 Myalgia, unspecified site: Secondary | ICD-10-CM | POA: Diagnosis not present

## 2023-06-15 DIAGNOSIS — I69352 Hemiplegia and hemiparesis following cerebral infarction affecting left dominant side: Secondary | ICD-10-CM | POA: Diagnosis not present

## 2023-06-15 DIAGNOSIS — I10 Essential (primary) hypertension: Secondary | ICD-10-CM | POA: Diagnosis not present

## 2023-06-15 DIAGNOSIS — E1169 Type 2 diabetes mellitus with other specified complication: Secondary | ICD-10-CM | POA: Diagnosis not present

## 2023-06-19 ENCOUNTER — Encounter (HOSPITAL_COMMUNITY): Payer: Self-pay

## 2023-06-19 ENCOUNTER — Ambulatory Visit (HOSPITAL_COMMUNITY)

## 2023-06-19 DIAGNOSIS — R531 Weakness: Secondary | ICD-10-CM | POA: Diagnosis not present

## 2023-06-19 DIAGNOSIS — R262 Difficulty in walking, not elsewhere classified: Secondary | ICD-10-CM

## 2023-06-19 NOTE — Therapy (Signed)
 OUTPATIENT PHYSICAL THERAPY NEURO TREATMENT   Patient Name: Joseph Peterson MRN: [id number] DOB:1950/06/14, 73 y.o., male Today's Date: 06/19/2023   PCP: Carylon Perches, MD REFERRING PROVIDER: Carylon Perches, MD  END OF SESSION:  PT End of Session - 06/19/23 1340     Visit Number 4    Number of Visits 8    Date for PT Re-Evaluation 07/04/23    Authorization Type [employer] medicare    Progress Note Due on Visit 8    PT Start Time 1300    PT Stop Time 1340    PT Time Calculation (min) 40 min    Equipment Utilized During Treatment Gait belt    Activity Tolerance Patient tolerated treatment well    Behavior During Therapy WFL for tasks assessed/performed              Past Medical History:  Diagnosis Date   COPD (chronic obstructive pulmonary disease) (HCC)    Hypercholesteremia    Hypertension    S/P TKR (total knee replacement) using cement, right 05/21/2018   Sleep apnea    Past Surgical History:  Procedure Laterality Date   COLONOSCOPY N/A 05/26/2013   Procedure: COLONOSCOPY;  Surgeon: Corbin Ade, MD;  [facility];  Service: Endoscopy;  Laterality: N/A;  1:00 PM-moved to [address] notified pt   COLONOSCOPY WITH PROPOFOL N/A 04/08/2020   Procedure: COLONOSCOPY WITH PROPOFOL;  Surgeon: Corbin Ade, MD;  [facility];  Service: Endoscopy;  Laterality: N/A;  10:30am   POLYPECTOMY  04/08/2020   Procedure: POLYPECTOMY;  Surgeon: Corbin Ade, MD;  [facility];  Service: Endoscopy;;   TOTAL KNEE ARTHROPLASTY Right 05/21/2018   Procedure: TOTAL KNEE ARTHROPLASTY-RIGHT;  Surgeon: Kennedy Bucker, MD;  Location: [facility];  Service: Orthopedics;  Laterality: Right;   Patient Active Problem List   Diagnosis Date Noted   Obesity, Class II, BMI 35-39.9 05/29/2023   Acute ischemic stroke (HCC) 05/28/2023   Essential hypertension    COPD (chronic obstructive pulmonary disease) (HCC)    Sleep apnea    History of adenomatous polyp of colon  02/17/2020    ONSET DATE: 05/25/23 last known well date  REFERRING DIAG: stroke, lt side weakness  THERAPY DIAG:  Left-sided weakness  Difficulty in walking, not elsewhere classified  Rationale for Evaluation and Treatment: Rehabilitation  SUBJECTIVE:                                                                                                                                                                                             SUBJECTIVE STATEMENT: 06/19/23: Pt reporting no  pain or issues currently as of today. No issues with HEP.    Eval:  Went to bed 2/28 without issue; woke up that sat morning 2/29 left side weakness.  That Monday morning could not get out of chair so went to Urgent Care who sent to ED.  Now referred to outpatient therapy  Pt accompanied by:  wife  PERTINENT HISTORY: R TKA 2020  PAIN:  Are you having pain? Yes: NPRS scale: 0/10 Pain location: left hip Pain description: right now not hurting Aggravating factors: prolonged walking Relieving factors: rest  PRECAUTIONS: Fall     WEIGHT BEARING RESTRICTIONS: No  FALLS: Has patient fallen in last 6 months? No  LIVING ENVIRONMENT: Lives with: lives with their spouse Lives in: House/apartment Stairs: Yes: External: 4 steps; on right going up, on left going up, and can reach both Has following equipment at home: Single point cane, Walker - 2 wheeled, Walker - 4 wheeled, and bed side commode  PLOF: Independent  currently hard time with buttons; hard time pulling up his pants with left hand  PATIENT GOALS: get back to normal  OBJECTIVE:  Note: Objective measures were completed at Evaluation unless otherwise noted.  DIAGNOSTIC FINDINGS:   COGNITION: Overall cognitive status: Within functional limits for tasks assessed   SENSATION: WFL  COORDINATION: Wfl lower extremity  EDEMA:  None noted  MUSCLE TONE:    POSTURE: rounded shoulders, forward head, and flexed trunk   LOWER EXTREMITY  ROM:     Active  Right Eval Left Eval  Hip flexion    Hip extension    Hip abduction    Hip adduction    Hip internal rotation    Hip external rotation    Knee flexion    Knee extension    Ankle dorsiflexion    Ankle plantarflexion    Ankle inversion    Ankle eversion     (Blank rows = not tested)  LOWER EXTREMITY MMT:    MMT Right Eval Left Eval  Hip flexion 5 4+  Hip extension 3 2  Hip abduction    Hip adduction    Hip internal rotation    Hip external rotation    Knee flexion 4+ 3-  Knee extension 5 4+  Ankle dorsiflexion 5 4  Ankle plantarflexion    Ankle inversion    Ankle eversion    Decreased grip strength noted left versus right  (Blank rows = not tested)  BED MOBILITY: takes extra time and effort to perform Sit to supine Modified independence Supine to sit Modified independence Rolling to Right Modified independence Rolling to Left Modified independence  TRANSFERS: Assistive device utilized: Environmental consultant - 4 wheeled  Sit to stand: Modified independence Stand to sit: Modified independence Chair to chair: Modified independence Floor:  not tested   STAIRS: Next visit 06/11/23:  step to with bil HR's, 4" step height  GAIT: Gait pattern: decreased step length- Left, decreased stance time- Left, and decreased ankle dorsiflexion- Left Distance walked: 331 ft with rollator Assistive device utilized: Environmental consultant - 4 wheeled Level of assistance: Modified independence Comments: tends walk with forward flexed trunk; "foot slap" left foot  FUNCTIONAL TESTS:  5 times sit to stand: 14.69 sec not UE assist 2 minute walk test: 331 ft with rollator SLS right 3 sec; left unable  PATIENT SURVEYS:  LEFS 31/80 38.8 %  TREATMENT DATE:  06/19/2023  -Tandem stance in // bars 2 x 1' -Standing hip extension isometric with RLE on 12in box 8 x  10'' -4in stepups with no UE support 2 x 10 with 3lb ankle weight on foot-proprioceptive input -Standing knee flexion with 3lb ankle weight- tactile cues to reduce hip flexion compensation to increased active knee flexion -semi tandem stance over half bolster with increased spacing with verbal cues for gluteal extension and balance for gait training without AD 2 x 1' -Standing ankle DF on decline 2x20 -Modified SL balance in // bars- contralateral anterior and lateral tapping on dots with finger assist 2 x 1' -196ft ambulation without AD- supervision; continues with L trendelenberg during stance phase.  -Standing hip abduction on LLE with finger assist x 20  06/12/23: Gait training with SPC (advised to use a cane with solid base) 241ft Seated: -STS 2x 10 -facing treadmill in chair hamstring curls 3 sets x 1 ' Standing:  Squats 2sets 8 reps (front of chair with cueing for mechanics)  Heel raises on incline slope 2x 10  Toe raises on decline slope 2x 10  Sidestep with GTB around thigh 4RT inside // bars  Hamstring curls 2x 8 reps 3" holds BLE  Wall arch 8x 3"  Nustep [place] 5' SPM >65   06/11/23 Sit to stands no UE 10X Standing:  heelrasies 0X  Toeraises on decline 20X  Hip abductions 2X10  Hip extensions 2X10   Lunges onto 4" step  4" lateral step ups 2X10 each with 1 UE assist  Forward step ups 4" 2X10 each with 1 UE assist Gait with SPC 100 feet step through but with antalgia due to Lt hip pain Stairs 4" with bil HR step to up and down 1RT Nustep seat 10 UE/LE level 4 5 minutes [place]    06/06/23 physical therapy evaluation and HEP instruction    PATIENT EDUCATION: Education details: Patient educated on exam findings, POC, scope of PT, HEP, and what to expect next visit. Person educated: Patient Education method: Explanation, Demonstration, and Handouts Education comprehension: verbalized understanding, returned demonstration, verbal cues required, and tactile  cues required HOME EXERCISE PROGRAM: Access Code: [email or web address] URL: [email or web address] Date: 06/06/2023 Prepared by: [facility]  Exercises - Sit to Stand with Armchair  - 2 x daily - 7 x weekly - 1 sets - 10 reps - Supine Bridge  - 2 x daily - 7 x weekly - 1 sets - 10 reps - Putty Squeezes  - 2 x daily - 7 x weekly - 1 sets - 50 reps  - Squat with Chair and Counter Support  - 2 x daily - 7 x weekly - 1 sets - 10 reps - Standing Hamstring Curl with Chair Support  - 1 x daily - 7 x weekly - 3 sets - 10 reps - Side Stepping with Resistance at Thighs and Counter Support  - 1 x daily - 7 x weekly - 1 sets - 5 reps  Access Code: [email or web address] URL: [email or web address] Date: 06/19/2023 Prepared by: Starling Manns  Exercises - Standing Hip Abduction with Resistance at Ankles and Counter Support  - 1 x daily - 7 x weekly - 3 sets - 15-20 reps - 3 hold  GOALS: Goals reviewed with patient? No  SHORT TERM GOALS: Target date: 06/20/2023  patient will be independent with initial HEP  Baseline: Goal status: INITIAL  2.  Patient will report 50% improvement overall  Baseline:  Goal status: INITIAL  LONG TERM GOALS: Target date: 07/04/2023  Patient will be independent in self management strategies to improve quality of life and functional outcomes.  Baseline:  Goal status: INITIAL  2.  Patient will report 75% improvement overall  Baseline:  Goal status: INITIAL  3.  Patient will  improve LEFS score by 9  points to demonstrate improved perceived function  Baseline: 31/80 Goal status: INITIAL  4.  Patient will be able to stand SLS on left leg 4" to demonstrate improved functional balance Baseline: unable Goal status: INITIAL  5.  Patient will increase distance on to 375 ft  to demonstrate improved functional mobility walking household and community distances.    Baseline: 331 ft with rollator Goal status: INITIAL   ASSESSMENT:  CLINICAL  IMPRESSION: Pt tolerating therapy session well today. Focused on LLE posterior chain strengthening and increasing propriocetion with balance to promote safer gait pattern and reducing AD. Pt shows poor postural support with isolating L gluteal activation. Tolerates challenges well. Pt uses compensatory trunk shifts/momentum swings to facilitates full arch of motion during interventions. Pt will benefit from skilled Physical Therapy services to address deficits/limitations in order to improve functional and QOL.   OBJECTIVE IMPAIRMENTS: Abnormal gait, decreased activity tolerance, decreased balance, difficulty walking, decreased ROM, decreased strength, hypomobility, increased fascial restrictions, impaired perceived functional ability, and pain.   ACTIVITY LIMITATIONS: carrying, lifting, bending, standing, squatting, stairs, transfers, bed mobility, bathing, locomotion level, and caring for others  PARTICIPATION LIMITATIONS: meal prep, cleaning, shopping, community activity, and yard work  [facility] POTENTIAL: Good  CLINICAL DECISION MAKING: Evolving/moderate complexity  EVALUATION COMPLEXITY: Moderate  PLAN:  PT FREQUENCY: 2x/week  PT DURATION: 4 weeks  PLANNED INTERVENTIONS: 97164- PT Re-evaluation, 97110-Therapeutic exercises, 97530- Therapeutic activity, 97112- Neuromuscular re-education, 97535- Self Care, 13086- Manual therapy, [id number]- Gait training, [phone]- Orthotic Fit/training, [phone]- Canalith repositioning, [id number]- Aquatic Therapy, [phone]- Splinting, Patient/Family education, Balance training, Stair training, Taping, Dry Needling, Joint mobilization, Joint manipulation, Spinal manipulation, Spinal mobilization, Scar mobilization, and DME instructions.   PLAN FOR NEXT SESSION: Next session add hamstring flexion, balance challenges and continued LE strengthening.  Continue to work on gait with LRAD.  Pt to speak to MD Friday regarding OT evaluation for left upper extremity.  Update HEP as  appropriate.  Nelida Meuse PT, DPT [facility] [phone] office  1:40 PM, 06/19/23

## 2023-06-21 ENCOUNTER — Ambulatory Visit (HOSPITAL_COMMUNITY)

## 2023-06-22 ENCOUNTER — Encounter (HOSPITAL_COMMUNITY): Payer: Self-pay

## 2023-06-22 ENCOUNTER — Ambulatory Visit (HOSPITAL_COMMUNITY)

## 2023-06-22 DIAGNOSIS — R531 Weakness: Secondary | ICD-10-CM | POA: Diagnosis not present

## 2023-06-22 DIAGNOSIS — R262 Difficulty in walking, not elsewhere classified: Secondary | ICD-10-CM

## 2023-06-22 NOTE — Therapy (Signed)
 OUTPATIENT PHYSICAL THERAPY NEURO TREATMENT   Patient Name: Joseph Peterson MRN: [id number] DOB:1950-10-01, 73 y.o., male Today's Date: 06/22/2023   PCP: Carylon Perches, MD REFERRING PROVIDER: Carylon Perches, MD  END OF SESSION:  PT End of Session - 06/22/23 1146     Visit Number 5    Number of Visits 8    Date for PT Re-Evaluation 07/04/23    Authorization Type [employer] medicare    Progress Note Due on Visit 8    PT Start Time 1108    PT Stop Time 1146    PT Time Calculation (min) 38 min    Equipment Utilized During Treatment Gait belt    Activity Tolerance Patient tolerated treatment well    Behavior During Therapy WFL for tasks assessed/performed               Past Medical History:  Diagnosis Date   COPD (chronic obstructive pulmonary disease) (HCC)    Hypercholesteremia    Hypertension    S/P TKR (total knee replacement) using cement, right 05/21/2018   Sleep apnea    Past Surgical History:  Procedure Laterality Date   COLONOSCOPY N/A 05/26/2013   Procedure: COLONOSCOPY;  Surgeon: Corbin Ade, MD;  [facility];  Service: Endoscopy;  Laterality: N/A;  1:00 PM-moved to [address] notified pt   COLONOSCOPY WITH PROPOFOL N/A 04/08/2020   Procedure: COLONOSCOPY WITH PROPOFOL;  Surgeon: Corbin Ade, MD;  [facility]: [facility];  Service: Endoscopy;  Laterality: N/A;  10:30am   POLYPECTOMY  04/08/2020   Procedure: POLYPECTOMY;  Surgeon: Corbin Ade, MD;  [facility]: [facility];  Service: Endoscopy;;   TOTAL KNEE ARTHROPLASTY Right 05/21/2018   Procedure: TOTAL KNEE ARTHROPLASTY-RIGHT;  Surgeon: Kennedy Bucker, MD;  Location: [facility];  Service: Orthopedics;  Laterality: Right;   Patient Active Problem List   Diagnosis Date Noted   Obesity, Class II, BMI 35-39.9 05/29/2023   Acute ischemic stroke (HCC) 05/28/2023   Essential hypertension    COPD (chronic obstructive pulmonary disease) (HCC)    Sleep apnea    History of adenomatous polyp of colon  02/17/2020    ONSET DATE: 05/25/23 last known well date  REFERRING DIAG: stroke, lt side weakness  THERAPY DIAG:  Left-sided weakness  Difficulty in walking, not elsewhere classified  Rationale for Evaluation and Treatment: Rehabilitation  SUBJECTIVE:                                                                                                                                                                                             SUBJECTIVE STATEMENT: 06/22/2023: Pt reporting  increased soreness in his left hip.    Eval:  Went to bed 2/28 without issue; woke up that sat morning 2/29 left side weakness.  That Monday morning could not get out of chair so went to Urgent Care who sent to ED.  Now referred to outpatient therapy  Pt accompanied by:  wife  PERTINENT HISTORY: R TKA 2020  PAIN:  Are you having pain? Yes: NPRS scale: 0/10 Pain location: left hip Pain description: right now not hurting Aggravating factors: prolonged walking Relieving factors: rest  PRECAUTIONS: Fall     WEIGHT BEARING RESTRICTIONS: No  FALLS: Has patient fallen in last 6 months? No  LIVING ENVIRONMENT: Lives with: lives with their spouse Lives in: House/apartment Stairs: Yes: External: 4 steps; on right going up, on left going up, and can reach both Has following equipment at home: Single point cane, Walker - 2 wheeled, Walker - 4 wheeled, and bed side commode  PLOF: Independent  currently hard time with buttons; hard time pulling up his pants with left hand  PATIENT GOALS: get back to normal  OBJECTIVE:  Note: Objective measures were completed at Evaluation unless otherwise noted.  DIAGNOSTIC FINDINGS:   COGNITION: Overall cognitive status: Within functional limits for tasks assessed   SENSATION: WFL  COORDINATION: Wfl lower extremity  EDEMA:  None noted  MUSCLE TONE:    POSTURE: rounded shoulders, forward head, and flexed trunk   LOWER EXTREMITY ROM:     Active   Right Eval Left Eval  Hip flexion    Hip extension    Hip abduction    Hip adduction    Hip internal rotation    Hip external rotation    Knee flexion    Knee extension    Ankle dorsiflexion    Ankle plantarflexion    Ankle inversion    Ankle eversion     (Blank rows = not tested)  LOWER EXTREMITY MMT:    MMT Right Eval Left Eval  Hip flexion 5 4+  Hip extension 3 2  Hip abduction    Hip adduction    Hip internal rotation    Hip external rotation    Knee flexion 4+ 3-  Knee extension 5 4+  Ankle dorsiflexion 5 4  Ankle plantarflexion    Ankle inversion    Ankle eversion    Decreased grip strength noted left versus right  (Blank rows = not tested)  BED MOBILITY: takes extra time and effort to perform Sit to supine Modified independence Supine to sit Modified independence Rolling to Right Modified independence Rolling to Left Modified independence  TRANSFERS: Assistive device utilized: Environmental consultant - 4 wheeled  Sit to stand: Modified independence Stand to sit: Modified independence Chair to chair: Modified independence Floor:  not tested   STAIRS: Next visit 06/11/23:  step to with bil HR's, 4" step height  GAIT: Gait pattern: decreased step length- Left, decreased stance time- Left, and decreased ankle dorsiflexion- Left Distance walked: 331 ft with rollator Assistive device utilized: Environmental consultant - 4 wheeled Level of assistance: Modified independence Comments: tends walk with forward flexed trunk; "foot slap" left foot  FUNCTIONAL TESTS:  5 times sit to stand: 14.69 sec not UE assist 2 minute walk test: 331 ft with rollator SLS right 3 sec; left unable  PATIENT SURVEYS:  LEFS 31/80 38.8 %  TREATMENT DATE:  06/22/2023  -Seated Piriformis stretch 2 x 1' -Standing hip vectors with RTB 2 x 10 for 5'' with UE assist in // bars -Side  stepping in parallel bars 3 x 1' with Red, green and black theraband.   -2x51ft black theraband forward/backward Monster walks -bodycraft walkouts x 4 with #2 plate lateral and backwards -Hike hike x 20  06/19/2023  -Tandem stance in // bars 2 x 1' -Standing hip extension isometric with RLE on 12in box 8 x 10'' -4in stepups with no UE support 2 x 10 with 3lb ankle weight on foot-proprioceptive input -Standing knee flexion with 3lb ankle weight- tactile cues to reduce hip flexion compensation to increased active knee flexion -semi tandem stance over half bolster with increased spacing with verbal cues for gluteal extension and balance for gait training without AD 2 x 1' -Standing ankle DF on decline 2x20 -Modified SL balance in // bars- contralateral anterior and lateral tapping on dots with finger assist 2 x 1' -135ft ambulation without AD- supervision; continues with L trendelenberg during stance phase.  -Standing hip abduction on LLE with finger assist x 20  06/12/23: Gait training with SPC (advised to use a cane with solid base) 257ft Seated: -STS 2x 10 -facing treadmill in chair hamstring curls 3 sets x 1 ' Standing:  Squats 2sets 8 reps (front of chair with cueing for mechanics)  Heel raises on incline slope 2x 10  Toe raises on decline slope 2x 10  Sidestep with GTB around thigh 4RT inside // bars  Hamstring curls 2x 8 reps 3" holds BLE  Wall arch 8x 3"  Nustep [place] 5' SPM >65    PATIENT EDUCATION: Education details: Patient educated on exam findings, POC, scope of PT, HEP, and what to expect next visit. Person educated: Patient Education method: Explanation, Demonstration, and Handouts Education comprehension: verbalized understanding, returned demonstration, verbal cues required, and tactile cues required HOME EXERCISE PROGRAM: Access Code: [email or web address] URL: [email or web address] Date: 06/06/2023 Prepared by: [facility]  Exercises - Sit to Stand  with Armchair  - 2 x daily - 7 x weekly - 1 sets - 10 reps - Supine Bridge  - 2 x daily - 7 x weekly - 1 sets - 10 reps - Putty Squeezes  - 2 x daily - 7 x weekly - 1 sets - 50 reps  - Squat with Chair and Counter Support  - 2 x daily - 7 x weekly - 1 sets - 10 reps - Standing Hamstring Curl with Chair Support  - 1 x daily - 7 x weekly - 3 sets - 10 reps - Side Stepping with Resistance at Thighs and Counter Support  - 1 x daily - 7 x weekly - 1 sets - 5 reps  Access Code: [email or web address] URL: [email or web address] Date: 06/22/2023 Prepared by: Starling Manns  Exercises - Standing Hip Abduction with Resistance at Ankles and Counter Support  - 1 x daily - 7 x weekly - 3 sets - 15-20 reps - 3 hold - Side Stepping with Resistance at Ankles  - 1 x daily - 7 x weekly - 3 sets - 10 reps - Forward Monster Walks  - 1 x daily - 7 x weekly - 3 sets - 10 reps - Standing Hip Hiking  - 1 x daily - 7 x weekly - 3 sets - 10 reps  GOALS: Goals reviewed with patient? No  SHORT TERM GOALS: Target date: 06/20/2023  patient will be independent with initial HEP  Baseline: Goal status: INITIAL  2.  Patient will report 50% improvement overall  Baseline:  Goal status: INITIAL   LONG TERM GOALS: Target date: 07/04/2023  Patient will be independent in self management strategies to improve quality of life and functional outcomes.  Baseline:  Goal status: INITIAL  2.  Patient will report 75% improvement overall  Baseline:  Goal status: INITIAL  3.  Patient will  improve LEFS score by 9  points to demonstrate improved perceived function  Baseline: 31/80 Goal status: INITIAL  4.  Patient will be able to stand SLS on left leg 4" to demonstrate improved functional balance Baseline: unable Goal status: INITIAL  5.  Patient will increase distance on to 375 ft  to demonstrate improved functional mobility walking household and community distances.    Baseline: 331 ft with  rollator Goal status: INITIAL   ASSESSMENT:  CLINICAL IMPRESSION: Pt tolerating treatment session well. Focused primarily on gluteus medius strengthening with functional balance and gait activities. Poor gluteal control with normal walking and primary focus was re-education and re-integration of gluteal control. Appropriate fatigue reported as pt noted increased tightness in gluteus medius pattern. Pt will benefit from skilled Physical Therapy services to address deficits/limitations in order to improve functional and QOL.   OBJECTIVE IMPAIRMENTS: Abnormal gait, decreased activity tolerance, decreased balance, difficulty walking, decreased ROM, decreased strength, hypomobility, increased fascial restrictions, impaired perceived functional ability, and pain.   ACTIVITY LIMITATIONS: carrying, lifting, bending, standing, squatting, stairs, transfers, bed mobility, bathing, locomotion level, and caring for others  PARTICIPATION LIMITATIONS: meal prep, cleaning, shopping, community activity, and yard work  [facility] POTENTIAL: Good  CLINICAL DECISION MAKING: Evolving/moderate complexity  EVALUATION COMPLEXITY: Moderate  PLAN:  PT FREQUENCY: 2x/week  PT DURATION: 4 weeks  PLANNED INTERVENTIONS: 97164- PT Re-evaluation, 97110-Therapeutic exercises, 97530- Therapeutic activity, 97112- Neuromuscular re-education, 97535- Self Care, 47829- Manual therapy, [id number]- Gait training, [phone]- Orthotic Fit/training, [phone]- Canalith repositioning, [id number]- Aquatic Therapy, [phone]- Splinting, Patient/Family education, Balance training, Stair training, Taping, Dry Needling, Joint mobilization, Joint manipulation, Spinal manipulation, Spinal mobilization, Scar mobilization, and DME instructions.   PLAN FOR NEXT SESSION: Next session add hamstring flexion, balance challenges and continued LE strengthening.  Continue to work on gait with LRAD.  Pt to speak to MD Friday regarding OT evaluation for left upper extremity.   Update HEP as appropriate.  Nelida Meuse PT, DPT [facility]- [facility] [phone] office  11:46 AM, 06/22/23

## 2023-06-23 DIAGNOSIS — I639 Cerebral infarction, unspecified: Secondary | ICD-10-CM | POA: Diagnosis not present

## 2023-06-26 ENCOUNTER — Ambulatory Visit (HOSPITAL_COMMUNITY): Attending: Internal Medicine

## 2023-06-26 ENCOUNTER — Encounter (HOSPITAL_COMMUNITY): Payer: Self-pay

## 2023-06-26 DIAGNOSIS — R531 Weakness: Secondary | ICD-10-CM | POA: Diagnosis not present

## 2023-06-26 DIAGNOSIS — R262 Difficulty in walking, not elsewhere classified: Secondary | ICD-10-CM

## 2023-06-26 NOTE — Therapy (Signed)
 OUTPATIENT PHYSICAL THERAPY NEURO TREATMENT   Patient Name: Joseph Peterson MRN: [id number] DOB:[birth year], 73 y.o., male Today's Date: 06/26/2023   PCP: Carylon Perches, MD REFERRING PROVIDER: Carylon Perches, MD Next apt date: June 2025  END OF SESSION:  PT End of Session - 06/26/23 1059     Visit Number 6    Number of Visits 8    Date for PT Re-Evaluation 07/04/23    Authorization Type [employer] medicare    Progress Note Due on Visit 8    PT Start Time 1100    PT Stop Time 1144    PT Time Calculation (min) 44 min    Equipment Utilized During Treatment Gait belt    Activity Tolerance Patient tolerated treatment well    Behavior During Therapy WFL for tasks assessed/performed               Past Medical History:  Diagnosis Date   COPD (chronic obstructive pulmonary disease) (HCC)    Hypercholesteremia    Hypertension    S/P TKR (total knee replacement) using cement, right 05/21/2018   Sleep apnea    Past Surgical History:  Procedure Laterality Date   COLONOSCOPY N/A 05/26/2013   Procedure: COLONOSCOPY;  Surgeon: Corbin Ade, MD;  [facility];  Service: Endoscopy;  Laterality: N/A;  1:00 PM-moved to [address] notified pt   COLONOSCOPY WITH PROPOFOL N/A 04/08/2020   Procedure: COLONOSCOPY WITH PROPOFOL;  Surgeon: Corbin Ade, MD;  [facility];  Service: Endoscopy;  Laterality: N/A;  10:30am   POLYPECTOMY  04/08/2020   Procedure: POLYPECTOMY;  Surgeon: Corbin Ade, MD;  [facility];  Service: Endoscopy;;   TOTAL KNEE ARTHROPLASTY Right 05/21/2018   Procedure: TOTAL KNEE ARTHROPLASTY-RIGHT;  Surgeon: Kennedy Bucker, MD;  Location: [facility];  Service: Orthopedics;  Laterality: Right;   Patient Active Problem List   Diagnosis Date Noted   Obesity, Class II, BMI 35-39.9 05/29/2023   Acute ischemic stroke (HCC) 05/28/2023   Essential hypertension    COPD (chronic obstructive pulmonary disease) (HCC)    Sleep apnea    History of  adenomatous polyp of colon 02/17/2020    ONSET DATE: 05/25/23 last known well date  REFERRING DIAG: stroke, lt side weakness  THERAPY DIAG:  Left-sided weakness  Difficulty in walking, not elsewhere classified  Rationale for Evaluation and Treatment: Rehabilitation  SUBJECTIVE:  SUBJECTIVE STATEMENT: 06/26/23:  Pt stated he has the "old age pain" in Lt hip and lower back.  Stated heat helps with pain.  Reports compliance with HEP daily.   Eval:  Went to bed 2/28 without issue; woke up that sat morning 2/29 left side weakness.  That Monday morning could not get out of chair so went to Urgent Care who sent to ED.  Now referred to outpatient therapy  Pt accompanied by:  wife  PERTINENT HISTORY: R TKA 2020  PAIN:  Are you having pain? Yes: NPRS scale: 0/10 Pain location: left hip Pain description: right now not hurting Aggravating factors: prolonged walking Relieving factors: rest  PRECAUTIONS: Fall     WEIGHT BEARING RESTRICTIONS: No  FALLS: Has patient fallen in last 6 months? No  LIVING ENVIRONMENT: Lives with: lives with their spouse Lives in: House/apartment Stairs: Yes: External: 4 steps; on right going up, on left going up, and can reach both Has following equipment at home: Single point cane, Walker - 2 wheeled, Walker - 4 wheeled, and bed side commode  PLOF: Independent  currently hard time with buttons; hard time pulling up his pants with left hand  PATIENT GOALS: get back to normal  OBJECTIVE:  Note: Objective measures were completed at Evaluation unless otherwise noted.  DIAGNOSTIC FINDINGS:   COGNITION: Overall cognitive status: Within functional limits for tasks assessed   SENSATION: WFL  COORDINATION: Wfl lower extremity  EDEMA:  None noted  MUSCLE TONE:     POSTURE: rounded shoulders, forward head, and flexed trunk   LOWER EXTREMITY ROM:     Active  Right Eval Left Eval  Hip flexion    Hip extension    Hip abduction    Hip adduction    Hip internal rotation    Hip external rotation    Knee flexion    Knee extension    Ankle dorsiflexion    Ankle plantarflexion    Ankle inversion    Ankle eversion     (Blank rows = not tested)  LOWER EXTREMITY MMT:    MMT Right Eval Left Eval  Hip flexion 5 4+  Hip extension 3 2  Hip abduction    Hip adduction    Hip internal rotation    Hip external rotation    Knee flexion 4+ 3-  Knee extension 5 4+  Ankle dorsiflexion 5 4  Ankle plantarflexion    Ankle inversion    Ankle eversion    Decreased grip strength noted left versus right  (Blank rows = not tested)  BED MOBILITY: takes extra time and effort to perform Sit to supine Modified independence Supine to sit Modified independence Rolling to Right Modified independence Rolling to Left Modified independence  TRANSFERS: Assistive device utilized: Environmental consultant - 4 wheeled  Sit to stand: Modified independence Stand to sit: Modified independence Chair to chair: Modified independence Floor:  not tested   STAIRS: Next visit 06/11/23:  step to with bil HR's, 4" step height  GAIT: Gait pattern: decreased step length- Left, decreased stance time- Left, and decreased ankle dorsiflexion- Left Distance walked: 331 ft with rollator Assistive device utilized: Environmental consultant - 4 wheeled Level of assistance: Modified independence Comments: tends walk with forward flexed trunk; "foot slap" left foot  FUNCTIONAL TESTS:  5 times sit to stand: 14.69 sec not UE assist 2 minute walk test: 331 ft with rollator SLS right 3 sec; left unable  PATIENT SURVEYS:  LEFS 31/80 38.8 %  TREATMENT DATE:  06/26/23: - Sidestep with  GTB 5RT inside // bars with minimal to no HHA - Abduction with GTB around thigh - Hamstring curls 10x 2 sets - 6in step tandem stance 2x 30" (2nd set with palloff with ball) 10x each - Leg press 4Pl 2x 10 - Bodycraft walkout 5RT 2Pl Retro and sidestep with CGA - Vector stance 3x 5" with   06/22/2023  -Seated Piriformis stretch 2 x 1' -Standing hip vectors with RTB 2 x 10 for 5'' with UE assist in // bars -Side stepping in parallel bars 3 x 1' with Red, green and black theraband.   -2x76ft black theraband forward/backward Monster walks -bodycraft walkouts x 4 with #2 plate lateral and backwards -Hike hike x 20  06/19/2023  -Tandem stance in // bars 2 x 1' -Standing hip extension isometric with RLE on 12in box 8 x 10'' -4in stepups with no UE support 2 x 10 with 3lb ankle weight on foot-proprioceptive input -Standing knee flexion with 3lb ankle weight- tactile cues to reduce hip flexion compensation to increased active knee flexion -semi tandem stance over half bolster with increased spacing with verbal cues for gluteal extension and balance for gait training without AD 2 x 1' -Standing ankle DF on decline 2x20 -Modified SL balance in // bars- contralateral anterior and lateral tapping on dots with finger assist 2 x 1' -114ft ambulation without AD- supervision; continues with L trendelenberg during stance phase.  -Standing hip abduction on LLE with finger assist x 20  06/12/23: Gait training with SPC (advised to use a cane with solid base) 252ft Seated: -STS 2x 10 -facing treadmill in chair hamstring curls 3 sets x 1 ' Standing:  Squats 2sets 8 reps (front of chair with cueing for mechanics)  Heel raises on incline slope 2x 10  Toe raises on decline slope 2x 10  Sidestep with GTB around thigh 4RT inside // bars  Hamstring curls 2x 8 reps 3" holds BLE  Wall arch 8x 3"  Nustep [place] 5' SPM >65    PATIENT EDUCATION: Education details: Patient educated on exam findings,  POC, scope of PT, HEP, and what to expect next visit. Person educated: Patient Education method: Explanation, Demonstration, and Handouts Education comprehension: verbalized understanding, returned demonstration, verbal cues required, and tactile cues required HOME EXERCISE PROGRAM: Access Code: [email or web address] URL: [email or web address] Date: 06/06/2023 Prepared by: [facility]  Exercises - Sit to Stand with Armchair  - 2 x daily - 7 x weekly - 1 sets - 10 reps - Supine Bridge  - 2 x daily - 7 x weekly - 1 sets - 10 reps - Putty Squeezes  - 2 x daily - 7 x weekly - 1 sets - 50 reps  - Squat with Chair and Counter Support  - 2 x daily - 7 x weekly - 1 sets - 10 reps - Standing Hamstring Curl with Chair Support  - 1 x daily - 7 x weekly - 3 sets - 10 reps - Side Stepping with Resistance at Thighs and Counter Support  - 1 x daily - 7 x weekly - 1 sets - 5 reps  Access Code: [email or web address] URL: [email or web address] Date: 06/22/2023 Prepared by: Starling Manns  Exercises - Standing Hip Abduction with Resistance at Ankles and Counter Support  - 1 x daily - 7 x weekly - 3 sets - 15-20 reps - 3 hold - Side Stepping with Resistance at Ankles  - 1 x daily - 7 x weekly -  3 sets - 10 reps - Forward Monster Walks  - 1 x daily - 7 x weekly - 3 sets - 10 reps - Standing Hip Hiking  - 1 x daily - 7 x weekly - 3 sets - 10 reps  GOALS: Goals reviewed with patient? No  SHORT TERM GOALS: Target date: 06/20/2023  patient will be independent with initial HEP  Baseline: Goal status: INITIAL  2.  Patient will report 50% improvement overall  Baseline:  Goal status: INITIAL   LONG TERM GOALS: Target date: 07/04/2023  Patient will be independent in self management strategies to improve quality of life and functional outcomes.  Baseline:  Goal status: INITIAL  2.  Patient will report 75% improvement overall  Baseline:  Goal status: INITIAL  3.  Patient will  improve LEFS  score by 9  points to demonstrate improved perceived function  Baseline: 31/80 Goal status: INITIAL  4.  Patient will be able to stand SLS on left leg 4" to demonstrate improved functional balance Baseline: unable Goal status: INITIAL  5.  Patient will increase distance on to 375 ft  to demonstrate improved functional mobility walking household and community distances.    Baseline: 331 ft with rollator Goal status: INITIAL   ASSESSMENT:  CLINICAL IMPRESSION: Pt tolerating treatment session well. Focused primarily on gluteus medius strengthening with functional balance and gait activities. Poor gluteal control with normal walking and primary focus was re-education and re-integration of gluteal control. Appropriate fatigue reported as pt noted increased tightness in gluteus medius pattern. Pt will benefit from skilled Physical Therapy services to address deficits/limitations in order to improve functional and QOL.   Session focus witht posterior chain strengthening.  Added leg press for posterior chain strengthening.  Cueing during walkout exercise to normalize gait mechanics as tendency to circumduct hips vs utilize proper ankle, knee and hip mechanics to increase hamstring activation.  Pt presents with appropriate fatigue levels with 1 rest break through session.  No reports of pain.    OBJECTIVE IMPAIRMENTS: Abnormal gait, decreased activity tolerance, decreased balance, difficulty walking, decreased ROM, decreased strength, hypomobility, increased fascial restrictions, impaired perceived functional ability, and pain.   ACTIVITY LIMITATIONS: carrying, lifting, bending, standing, squatting, stairs, transfers, bed mobility, bathing, locomotion level, and caring for others  PARTICIPATION LIMITATIONS: meal prep, cleaning, shopping, community activity, and yard work  [facility] POTENTIAL: Good  CLINICAL DECISION MAKING: Evolving/moderate complexity  EVALUATION COMPLEXITY:  Moderate  PLAN:  PT FREQUENCY: 2x/week  PT DURATION: 4 weeks  PLANNED INTERVENTIONS: 97164- PT Re-evaluation, 97110-Therapeutic exercises, 97530- Therapeutic activity, 97112- Neuromuscular re-education, 97535- Self Care, 32440- Manual therapy, [id number]- Gait training, [phone]- Orthotic Fit/training, [phone]- Canalith repositioning, [id number]- Aquatic Therapy, [phone]- Splinting, Patient/Family education, Balance training, Stair training, Taping, Dry Needling, Joint mobilization, Joint manipulation, Spinal manipulation, Spinal mobilization, Scar mobilization, and DME instructions.   PLAN FOR NEXT SESSION: Next session add hamstring flexion, balance challenges and continued LE strengthening.  Continue to work on gait with LRAD.  Pt to speak to MD Friday regarding OT evaluation for left upper extremity.  Update HEP as appropriate.  Becky Sax, LPTA/CLT; CBIS [phone]   2:16 PM, 06/26/23

## 2023-06-28 ENCOUNTER — Ambulatory Visit (HOSPITAL_COMMUNITY): Admitting: Physical Therapy

## 2023-06-28 DIAGNOSIS — R262 Difficulty in walking, not elsewhere classified: Secondary | ICD-10-CM

## 2023-06-28 DIAGNOSIS — R531 Weakness: Secondary | ICD-10-CM | POA: Diagnosis not present

## 2023-06-28 NOTE — Therapy (Signed)
 OUTPATIENT PHYSICAL THERAPY NEURO TREATMENT   Patient Name: Joseph Peterson MRN: [id number] DOB:[birth year], 73 y.o., male Today's Date: 06/28/2023   PCP: Carylon Perches, MD REFERRING PROVIDER: Carylon Perches, MD Next apt date: June 2025  END OF SESSION:  PT End of Session - 06/28/23 1610     Visit Number 7    Number of Visits 8    Date for PT Re-Evaluation 07/04/23    Authorization Type [employer] medicare    Progress Note Due on Visit 8    PT Start Time 1505    PT Stop Time 1550    PT Time Calculation (min) 45 min    Equipment Utilized During Treatment Gait belt    Activity Tolerance Patient tolerated treatment well    Behavior During Therapy WFL for tasks assessed/performed                Past Medical History:  Diagnosis Date   COPD (chronic obstructive pulmonary disease) (HCC)    Hypercholesteremia    Hypertension    S/P TKR (total knee replacement) using cement, right 05/21/2018   Sleep apnea    Past Surgical History:  Procedure Laterality Date   COLONOSCOPY N/A 05/26/2013   Procedure: COLONOSCOPY;  Surgeon: Corbin Ade, MD;  Location: [facility];  Service: Endoscopy;  Laterality: N/A;  1:00 PM-moved to [address] notified pt   COLONOSCOPY WITH PROPOFOL N/A 04/08/2020   Procedure: COLONOSCOPY WITH PROPOFOL;  Surgeon: Corbin Ade, MD;  Location: [facility];  Service: Endoscopy;  Laterality: N/A;  10:30am   POLYPECTOMY  04/08/2020   Procedure: POLYPECTOMY;  Surgeon: Corbin Ade, MD;  Location: [facility];  Service: Endoscopy;;   TOTAL KNEE ARTHROPLASTY Right 05/21/2018   Procedure: TOTAL KNEE ARTHROPLASTY-RIGHT;  Surgeon: Kennedy Bucker, MD;  Location: [facility];  Service: Orthopedics;  Laterality: Right;   Patient Active Problem List   Diagnosis Date Noted   Obesity, Class II, BMI 35-39.9 05/29/2023   Acute ischemic stroke (HCC) 05/28/2023   Essential hypertension    COPD (chronic obstructive pulmonary disease) (HCC)    Sleep apnea    History of  adenomatous polyp of colon 02/17/2020    ONSET DATE: 05/25/23 last known well date  REFERRING DIAG: stroke, lt side weakness  THERAPY DIAG:  Left-sided weakness  Difficulty in walking, not elsewhere classified  Rationale for Evaluation and Treatment: Rehabilitation  SUBJECTIVE:  SUBJECTIVE STATEMENT: 06/26/23:  Pt states still having pain in his Lt hip.  Brings is cane but does not use it while in therapy.  No other issues today.  Eval:  Went to bed 2/28 without issue; woke up that sat morning 2/29 left side weakness.  That Monday morning could not get out of chair so went to Urgent Care who sent to ED.  Now referred to outpatient therapy  Pt accompanied by:  wife  PERTINENT HISTORY: R TKA 2020  PAIN:  Are you having pain? Yes: NPRS scale: 0/10 Pain location: left hip Pain description: right now not hurting Aggravating factors: prolonged walking Relieving factors: rest  PRECAUTIONS: Fall     WEIGHT BEARING RESTRICTIONS: No  FALLS: Has patient fallen in last 6 months? No  LIVING ENVIRONMENT: Lives with: lives with their spouse Lives in: House/apartment Stairs: Yes: External: 4 steps; on right going up, on left going up, and can reach both Has following equipment at home: Single point cane, Walker - 2 wheeled, Walker - 4 wheeled, and bed side commode  PLOF: Independent  currently hard time with buttons; hard time pulling up his pants with left hand  PATIENT GOALS: get back to normal  OBJECTIVE:  Note: Objective measures were completed at Evaluation unless otherwise noted.  DIAGNOSTIC FINDINGS:   COGNITION: Overall cognitive status: Within functional limits for tasks assessed   SENSATION: WFL  COORDINATION: Wfl lower extremity  EDEMA:  None noted  MUSCLE TONE:     POSTURE: rounded shoulders, forward head, and flexed trunk   LOWER EXTREMITY MMT:    MMT Right Eval Left Eval  Hip flexion 5 4+  Hip extension 3 2  Hip abduction    Hip adduction    Hip internal rotation    Hip external rotation    Knee flexion 4+ 3-  Knee extension 5 4+  Ankle dorsiflexion 5 4  Ankle plantarflexion    Ankle inversion    Ankle eversion    Decreased grip strength noted left versus right  (Blank rows = not tested)  BED MOBILITY: takes extra time and effort to perform Sit to supine Modified independence Supine to sit Modified independence Rolling to Right Modified independence Rolling to Left Modified independence  TRANSFERS: Assistive device utilized: Environmental consultant - 4 wheeled  Sit to stand: Modified independence Stand to sit: Modified independence Chair to chair: Modified independence Floor:  not tested   STAIRS: Next visit 06/11/23:  step to with bil HR's, 4" step height  GAIT: Gait pattern: decreased step length- Left, decreased stance time- Left, and decreased ankle dorsiflexion- Left Distance walked: 331 ft with rollator Assistive device utilized: Environmental consultant - 4 wheeled Level of assistance: Modified independence Comments: tends walk with forward flexed trunk; "foot slap" left foot  FUNCTIONAL TESTS:  5 times sit to stand: 14.69 sec not UE assist 2 minute walk test: 331 ft with rollator SLS right 3 sec; left unable  PATIENT SURVEYS:  LEFS 31/80 38.8 %  TREATMENT DATE:  06/28/23: - Sidestep with BTB 2RT outside // on 20 foot line with CGA - Abduction with BTB around thigh - Hamstring curls 10x 2 sets - Vector stance 5x 5" with 1 UE only - Palloff BTB 10X each direction - Leg press 4Pl  2x10 - Bodycraft walkout 5RT 3Pl each direction SBA 5X each -hamstring stretch using 8" step Lt 2X20"  -Seated piriformis stretch Lt  20"  Nustep EOS seat 8 UE/LE 5 minutes level 4  06/26/23: - Sidestep with GTB 5RT inside // bars with minimal to no HHA - Abduction with GTB around thigh - Hamstring curls 10x 2 sets - 6in step tandem stance 2x 30" (2nd set with palloff with ball) 10x each - Leg press 4Pl 2x 10 - Bodycraft walkout 5RT 2Pl Retro and sidestep with CGA - Vector stance 3x 5" with   06/22/2023  -Seated Piriformis stretch 2 x 1' -Standing hip vectors with RTB 2 x 10 for 5'' with UE assist in // bars -Side stepping in parallel bars 3 x 1' with Red, green and black theraband.   -2x75ft black theraband forward/backward Monster walks -bodycraft walkouts x 4 with #2 plate lateral and backwards -Hike hike x 20  06/19/2023  -Tandem stance in // bars 2 x 1' -Standing hip extension isometric with RLE on 12in box 8 x 10'' -4in stepups with no UE support 2 x 10 with 3lb ankle weight on foot-proprioceptive input -Standing knee flexion with 3lb ankle weight- tactile cues to reduce hip flexion compensation to increased active knee flexion -semi tandem stance over half bolster with increased spacing with verbal cues for gluteal extension and balance for gait training without AD 2 x 1' -Standing ankle DF on decline 2x20 -Modified SL balance in // bars- contralateral anterior and lateral tapping on dots with finger assist 2 x 1' -166ft ambulation without AD- supervision; continues with L trendelenberg during stance phase.  -Standing hip abduction on LLE with finger assist x 20  06/12/23: Gait training with SPC (advised to use a cane with solid base) 274ft Seated: -STS 2x 10 -facing treadmill in chair hamstring curls 3 sets x 1 ' Standing:  Squats 2sets 8 reps (front of chair with cueing for mechanics)  Heel raises on incline slope 2x 10  Toe raises on decline slope 2x 10  Sidestep with GTB around thigh 4RT inside // bars  Hamstring curls 2x 8 reps 3" holds BLE  Wall arch 8x 3"  Nustep [place] 5' SPM  >65    PATIENT EDUCATION: Education details: Patient educated on exam findings, POC, scope of PT, HEP, and what to expect next visit. Person educated: Patient Education method: Explanation, Demonstration, and Handouts Education comprehension: verbalized understanding, returned demonstration, verbal cues required, and tactile cues required HOME EXERCISE PROGRAM: Access Code: [email or web address] URL: [email or web address] Date: 06/06/2023 Prepared by: [facility]  Exercises - Sit to Stand with Armchair  - 2 x daily - 7 x weekly - 1 sets - 10 reps - Supine Bridge  - 2 x daily - 7 x weekly - 1 sets - 10 reps - Putty Squeezes  - 2 x daily - 7 x weekly - 1 sets - 50 reps  - Squat with Chair and Counter Support  - 2 x daily - 7 x weekly - 1 sets - 10 reps - Standing Hamstring Curl with Chair Support  - 1 x daily - 7 x weekly - 3 sets - 10 reps - Side Stepping with  Resistance at Thighs and Counter Support  - 1 x daily - 7 x weekly - 1 sets - 5 reps  Access Code: [email or web address] URL: [email or web address] Date: 06/22/2023 Prepared by: Starling Manns  Exercises - Standing Hip Abduction with Resistance at Ankles and Counter Support  - 1 x daily - 7 x weekly - 3 sets - 15-20 reps - 3 hold - Side Stepping with Resistance at Ankles  - 1 x daily - 7 x weekly - 3 sets - 10 reps - Forward Monster Walks  - 1 x daily - 7 x weekly - 3 sets - 10 reps - Standing Hip Hiking  - 1 x daily - 7 x weekly - 3 sets - 10 reps  GOALS: Goals reviewed with patient? No  SHORT TERM GOALS: Target date: 06/20/2023  patient will be independent with initial HEP  Baseline: Goal status: INITIAL  2.  Patient will report 50% improvement overall  Baseline:  Goal status: INITIAL   LONG TERM GOALS: Target date: 07/04/2023  Patient will be independent in self management strategies to improve quality of life and functional outcomes.  Baseline:  Goal status: INITIAL  2.  Patient will report 75%  improvement overall  Baseline:  Goal status: INITIAL  3.  Patient will  improve LEFS score by 9  points to demonstrate improved perceived function  Baseline: 31/80 Goal status: INITIAL  4.  Patient will be able to stand SLS on left leg 4" to demonstrate improved functional balance Baseline: unable Goal status: INITIAL  5.  Patient will increase distance on to 375 ft  to demonstrate improved functional mobility walking household and community distances.    Baseline: 331 ft with rollator Goal status: INITIAL   ASSESSMENT:  CLINICAL IMPRESSION: Continued with bil LE strength and balance challenges.  Pt with continued complaint of Lt glut pain.  Pt shown standing hamstring stretch with some tightness but not felt in area.  Seated piriformis stretch did give positive results in area of tightness.  Increased to blue band today for LE resistance and added pallof with difficulty stabilizing, especially with band pulling left. Increased resistance of walkout activity by 10#  tolerating treatment session well with appropriate fatigue reported.  Pt will benefit from skilled Physical Therapy services to address deficits/limitations in order to improve functional and QOL.   Session focus witht posterior chain strengthening.  Added leg press for posterior chain strengthening.  Cueing during walkout exercise to normalize gait mechanics as tendency to circumduct hips vs utilize proper ankle, knee and hip mechanics to increase hamstring activation.  Pt presents with appropriate fatigue levels with 1 rest break through session.  No reports of pain.    OBJECTIVE IMPAIRMENTS: Abnormal gait, decreased activity tolerance, decreased balance, difficulty walking, decreased ROM, decreased strength, hypomobility, increased fascial restrictions, impaired perceived functional ability, and pain.   ACTIVITY LIMITATIONS: carrying, lifting, bending, standing, squatting, stairs, transfers, bed mobility, bathing,  locomotion level, and caring for others  PARTICIPATION LIMITATIONS: meal prep, cleaning, shopping, community activity, and yard work  [facility] POTENTIAL: Good  CLINICAL DECISION MAKING: Evolving/moderate complexity  EVALUATION COMPLEXITY: Moderate  PLAN:  PT FREQUENCY: 2x/week  PT DURATION: 4 weeks  PLANNED INTERVENTIONS: 97164- PT Re-evaluation, 97110-Therapeutic exercises, 97530- Therapeutic activity, 97112- Neuromuscular re-education, 97535- Self Care, 47829- Manual therapy, [id number]- Gait training, [id number]- Orthotic Fit/training, [phone]- Canalith repositioning, [id number]- Aquatic Therapy, [phone]- Splinting, Patient/Family education, Balance training, Stair training, Taping, Dry Needling, Joint mobilization, Joint manipulation, Spinal manipulation, Spinal mobilization, Scar mobilization, and DME  instructions.   PLAN FOR NEXT SESSION: continue with stretches, balance challenges and LE strengthening.   Update HEP as appropriate. Complete reassessment next session.  Lurena Nida, PTA/CLT [facility] Ph: [phone]  4:11 PM, 06/28/23

## 2023-07-03 ENCOUNTER — Encounter (HOSPITAL_COMMUNITY): Payer: Self-pay

## 2023-07-03 ENCOUNTER — Ambulatory Visit (HOSPITAL_COMMUNITY)

## 2023-07-03 DIAGNOSIS — R531 Weakness: Secondary | ICD-10-CM | POA: Diagnosis not present

## 2023-07-03 DIAGNOSIS — R262 Difficulty in walking, not elsewhere classified: Secondary | ICD-10-CM

## 2023-07-03 NOTE — Therapy (Signed)
 OUTPATIENT PHYSICAL THERAPY NEURO TREATMENT/PROGRESS NOTE/DISCHARGE Progress Note Reporting Period 06/06/2023 to 07/03/2023  See note below for Objective Data and Assessment of Progress/Goals.     PHYSICAL THERAPY DISCHARGE SUMMARY  Visits from Start of Care: 8  Current functional level related to goals / functional outcomes: See below   Remaining deficits: L gluteal muscle weakness   Education / Equipment: Advanced HEP   Patient agrees to discharge. Patient goals were met. Patient is being discharged due to being pleased with the current functional level.   Patient Name: Joseph Peterson MRN: [id number] DOB:1950/04/16, 73 y.o., male Today's Date: 07/03/2023   PCP: Carylon Perches, MD REFERRING PROVIDER: Carylon Perches, MD Next apt date: June 2025  END OF SESSION:  PT End of Session - 07/03/23 1221     Visit Number 8    Number of Visits 8    Date for PT Re-Evaluation 07/04/23    Authorization Type [employer] medicare    Progress Note Due on Visit 8    PT Start Time 1148    PT Stop Time 1221    PT Time Calculation (min) 33 min    Equipment Utilized During Treatment Gait belt    Activity Tolerance Patient tolerated treatment well    Behavior During Therapy WFL for tasks assessed/performed                 Past Medical History:  Diagnosis Date   COPD (chronic obstructive pulmonary disease) (HCC)    Hypercholesteremia    Hypertension    S/P TKR (total knee replacement) using cement, right 05/21/2018   Sleep apnea    Past Surgical History:  Procedure Laterality Date   COLONOSCOPY N/A 05/26/2013   Procedure: COLONOSCOPY;  Surgeon: Corbin Ade, MD;  [facility];  Service: Endoscopy;  Laterality: N/A;  1:00 PM-moved to [address] notified pt   COLONOSCOPY WITH PROPOFOL N/A 04/08/2020   Procedure: COLONOSCOPY WITH PROPOFOL;  Surgeon: Corbin Ade, MD;  [facility];  Service: Endoscopy;  Laterality: N/A;  10:30am   POLYPECTOMY  04/08/2020    Procedure: POLYPECTOMY;  Surgeon: Corbin Ade, MD;  [facility];  Service: Endoscopy;;   TOTAL KNEE ARTHROPLASTY Right 05/21/2018   Procedure: TOTAL KNEE ARTHROPLASTY-RIGHT;  Surgeon: Kennedy Bucker, MD;  Location: [facility];  Service: Orthopedics;  Laterality: Right;   Patient Active Problem List   Diagnosis Date Noted   Obesity, Class II, BMI 35-39.9 05/29/2023   Acute ischemic stroke (HCC) 05/28/2023   Essential hypertension    COPD (chronic obstructive pulmonary disease) (HCC)    Sleep apnea    History of adenomatous polyp of colon 02/17/2020    ONSET DATE: 05/25/23 last known well date  REFERRING DIAG: stroke, lt side weakness  THERAPY DIAG:  Left-sided weakness  Difficulty in walking, not elsewhere classified  Rationale for Evaluation and Treatment: Rehabilitation  SUBJECTIVE:  SUBJECTIVE STATEMENT: Pt reporting that he feels he is making good strides with PT. Feels he could be ready for discharge today.  Eval:  Went to bed 2/28 without issue; woke up that sat morning 2/29 left side weakness.  That Monday morning could not get out of chair so went to Urgent Care who sent to ED.  Now referred to outpatient therapy  Pt accompanied by:  wife  PERTINENT HISTORY: R TKA 2020  PAIN:  Are you having pain? Yes: NPRS scale: 0/10 Pain location: left hip Pain description: right now not hurting Aggravating factors: prolonged walking Relieving factors: rest  PRECAUTIONS: Fall     WEIGHT BEARING RESTRICTIONS: No  FALLS: Has patient fallen in last 6 months? No  LIVING ENVIRONMENT: Lives with: lives with their spouse Lives in: House/apartment Stairs: Yes: External: 4 steps; on right going up, on left going up, and can reach both Has following equipment at home: Single point  cane, Walker - 2 wheeled, Walker - 4 wheeled, and bed side commode  PLOF: Independent  currently hard time with buttons; hard time pulling up his pants with left hand  PATIENT GOALS: get back to normal  OBJECTIVE:  Note: Objective measures were completed at Evaluation unless otherwise noted.  DIAGNOSTIC FINDINGS:   COGNITION: Overall cognitive status: Within functional limits for tasks assessed   SENSATION: WFL  COORDINATION: Wfl lower extremity  EDEMA:  None noted  MUSCLE TONE:    POSTURE: rounded shoulders, forward head, and flexed trunk   LOWER EXTREMITY MMT:    MMT Right Eval Left Eval Right 07/03/2023 Left 07/03/2023  Hip flexion 5 4+ 5 4+  Hip extension 3 2 3+ 2+  Hip abduction      Hip adduction      Hip internal rotation      Hip external rotation      Knee flexion 4+ 3- 4+ 3+  Knee extension 5 4+ 5 4+  Ankle dorsiflexion 5 4 5  4+  Ankle plantarflexion      Ankle inversion      Ankle eversion      Decreased grip strength noted left versus right  (Blank rows = not tested)  BED MOBILITY: takes extra time and effort to perform Sit to supine Modified independence Supine to sit Modified independence Rolling to Right Modified independence Rolling to Left Modified independence  TRANSFERS: Assistive device utilized: Environmental consultant - 4 wheeled  Sit to stand: Modified independence Stand to sit: Modified independence Chair to chair: Modified independence Floor:  not tested   STAIRS: Next visit 06/11/23:  step to with bil HR's, 4" step height  GAIT: Gait pattern: decreased step length- Left, decreased stance time- Left, and decreased ankle dorsiflexion- Left Distance walked: 331 ft with rollator Assistive device utilized: Environmental consultant - 4 wheeled Level of assistance: Modified independence Comments: tends walk with forward flexed trunk; "foot slap" left foot  FUNCTIONAL TESTS:  5 times sit to stand: 14.69 sec not UE assist 2 minute walk test: 331 ft with  rollator SLS right 3 sec; left unable  07/03/2023 5x Sit/stand: 9.05 seconds w/o UE assist  60-64 years: Mean time of 10.23 seconds 65-69 years: Mean time of 10.61 seconds 70-74 years: Mean time of 11.75 seconds 75-79 years: Mean time of 12.52 seconds 80-89 years: 14.8 seconds is considered worse than average  : 484ft w/o AD   L SLS: 7 seconds  R SLS: 9.45 seconds  Norms: 18-39  F: 43.5 seconds  M: 43.2 seconds 40-49  F:  40.4 seconds  M: 40.1 seconds 50-59  F: 36 seconds  M: 38.1 seconds 60-69  F: 25.1 seconds  M: 28.7 seconds 70-79  F: 11.3 seconds  M: 18.3 seconds PATIENT SURVEYS:  LEFS 31/80 38.8 % LEFS 54 / 80 = 67.5 %                                                                                                                             TREATMENT DATE:  07/03/2023  -LEFS -MMT -5xSTS -SL balance - -Standing hip abduction isometric with contralateral isometric push on wall 8 x 10'' -Attempted Single leg eccentric step down 6in step with BUE support- pt fell into sitting position on step with min assist for controlled falled to bottom on stairs.  -4in single leg eccentric step down in // bars 2 x 15  06/28/23: - Sidestep with BTB 2RT outside // on 20 foot line with CGA - Abduction with BTB around thigh - Hamstring curls 10x 2 sets - Vector stance 5x 5" with 1 UE only - Palloff BTB 10X each direction - Leg press 4Pl  2x10 - Bodycraft walkout 5RT 3Pl each direction SBA 5X each -hamstring stretch using 8" step Lt 2X20"  -Seated piriformis stretch Lt 20"  Nustep EOS seat 8 UE/LE 5 minutes level 4  06/26/23: - Sidestep with GTB 5RT inside // bars with minimal to no HHA - Abduction with GTB around thigh - Hamstring curls 10x 2 sets - 6in step tandem stance 2x 30" (2nd set with palloff with ball) 10x each - Leg press 4Pl 2x 10 - Bodycraft walkout 5RT 2Pl Retro and sidestep with CGA - Vector stance 3x 5" with      PATIENT EDUCATION: Education  details: Patient educated on exam findings, POC, scope of PT, HEP, and what to expect next visit. Person educated: Patient Education method: Explanation, Demonstration, and Handouts Education comprehension: verbalized understanding, returned demonstration, verbal cues required, and tactile cues required HOME EXERCISE PROGRAM: Access Code: [email or web address] URL: [email or web address] Date: 06/06/2023 Prepared by: [facility]  Exercises - Sit to Stand with Armchair  - 2 x daily - 7 x weekly - 1 sets - 10 reps - Supine Bridge  - 2 x daily - 7 x weekly - 1 sets - 10 reps - Putty Squeezes  - 2 x daily - 7 x weekly - 1 sets - 50 reps  - Squat with Chair and Counter Support  - 2 x daily - 7 x weekly - 1 sets - 10 reps - Standing Hamstring Curl with Chair Support  - 1 x daily - 7 x weekly - 3 sets - 10 reps - Side Stepping with Resistance at Thighs and Counter Support  - 1 x daily - 7 x weekly - 1 sets - 5 reps  Access Code: [email or web address] URL: [email or web address] Date: 06/22/2023 Prepared by: Starling Manns  Exercises - Standing Hip Abduction with Resistance at Ankles and Counter Support  -  1 x daily - 7 x weekly - 3 sets - 15-20 reps - 3 hold - Side Stepping with Resistance at Ankles  - 1 x daily - 7 x weekly - 3 sets - 10 reps - Forward Monster Walks  - 1 x daily - 7 x weekly - 3 sets - 10 reps - Standing Hip Hiking  - 1 x daily - 7 x weekly - 3 sets - 10 reps  GOALS: Goals reviewed with patient? No  SHORT TERM GOALS: Target date: 06/20/2023  patient will be independent with initial HEP  Baseline: Goal status: INITIAL  2.  Patient will report 50% improvement overall  Baseline:  Goal status: INITIAL   LONG TERM GOALS: Target date: 07/04/2023  Patient will be independent in self management strategies to improve quality of life and functional outcomes.  Baseline:  Goal status: INITIAL  2.  Patient will report 75% improvement overall  Baseline:  Goal status:  INITIAL  3.  Patient will  improve LEFS score by 9  points to demonstrate improved perceived function  Baseline: 31/80 Goal status: INITIAL  4.  Patient will be able to stand SLS on left leg 4" to demonstrate improved functional balance Baseline: unable Goal status: INITIAL  5.  Patient will increase distance on to 375 ft  to demonstrate improved functional mobility walking household and community distances.    Baseline: 331 ft with rollator Goal status: INITIAL   ASSESSMENT:  CLINICAL IMPRESSION: Progress note today: Pt has met all goals. Pt is pleased with current level. Pt continues to show some deficits but is confident with doing participating in advanced independent HEP. Pt to be discharged from skilled PT services at this time.   Session focus witht posterior chain strengthening.  Added leg press for posterior chain strengthening.  Cueing during walkout exercise to normalize gait mechanics as tendency to circumduct hips vs utilize proper ankle, knee and hip mechanics to increase hamstring activation.  Pt presents with appropriate fatigue levels with 1 rest break through session.  No reports of pain.    OBJECTIVE IMPAIRMENTS: Abnormal gait, decreased activity tolerance, decreased balance, difficulty walking, decreased ROM, decreased strength, hypomobility, increased fascial restrictions, impaired perceived functional ability, and pain.   ACTIVITY LIMITATIONS: carrying, lifting, bending, standing, squatting, stairs, transfers, bed mobility, bathing, locomotion level, and caring for others  PARTICIPATION LIMITATIONS: meal prep, cleaning, shopping, community activity, and yard work  [facility] POTENTIAL: Good  CLINICAL DECISION MAKING: Evolving/moderate complexity  EVALUATION COMPLEXITY: Moderate  PLAN:  PT FREQUENCY: 2x/week  PT DURATION: 4 weeks  PLANNED INTERVENTIONS: 97164- PT Re-evaluation, 97110-Therapeutic exercises, 97530- Therapeutic activity, 97112-  Neuromuscular re-education, 97535- Self Care, 81191- Manual therapy, [id number]- Gait training, [phone]- Orthotic Fit/training, [phone]- Canalith repositioning, [id number]- Aquatic Therapy, [phone]- Splinting, Patient/Family education, Balance training, Stair training, Taping, Dry Needling, Joint mobilization, Joint manipulation, Spinal manipulation, Spinal mobilization, Scar mobilization, and DME instructions.   PLAN FOR NEXT SESSION: continue with stretches, balance challenges and LE strengthening.   Update HEP as appropriate. Complete reassessment next session.  Nelida Meuse PT, DPT [facility] [phone] office  12:22 PM, 07/03/23

## 2023-07-05 ENCOUNTER — Ambulatory Visit (HOSPITAL_COMMUNITY)

## 2023-07-12 ENCOUNTER — Ambulatory Visit (HOSPITAL_COMMUNITY)

## 2023-08-12 DIAGNOSIS — I639 Cerebral infarction, unspecified: Secondary | ICD-10-CM | POA: Diagnosis not present

## 2023-09-14 DIAGNOSIS — E119 Type 2 diabetes mellitus without complications: Secondary | ICD-10-CM | POA: Diagnosis not present

## 2023-09-21 DIAGNOSIS — I10 Essential (primary) hypertension: Secondary | ICD-10-CM | POA: Diagnosis not present

## 2023-09-21 DIAGNOSIS — Z8673 Personal history of transient ischemic attack (TIA), and cerebral infarction without residual deficits: Secondary | ICD-10-CM | POA: Diagnosis not present

## 2023-09-21 DIAGNOSIS — E1169 Type 2 diabetes mellitus with other specified complication: Secondary | ICD-10-CM | POA: Diagnosis not present

## 2023-11-30 NOTE — Progress Notes (Deleted)
 NEUROLOGY FOLLOW UP OFFICE NOTE  Joseph Peterson [id number]  Subjective:  Joseph Peterson is a 73 y.o. year old right-handed male with a medical history of HTN, HLD, OSA on CPAP, DM, COPD, former smoker who we last saw on 06/07/23 for stroke.  To briefly review: 06/07/23: Patient was in normal state of health when going to bed on 05/25/23. He woke up on 05/26/23. He had difficulty with balance and had to sit down. This improved over 1-2 hours. He then went out and did errands with everything seeming okay. Wife did not notice anything off. He also was doing well on 05/27/23. He woke up on 05/28/23. He was noticing difficulty getting out of his chair and couldn't button his shirt. He asked his wife to take him to the hospital.    iNIHSS was 2 (1 facial palsy, 1 LUE drift). MRI brain showed acute infarct in the posterior limb of the right internal capsule. MRA head showed Peterson large vessel occlusion or high grade stenosis. Carotid ultrasound showed Peterson significant stenosis. TEE showed Peterson significant abnormalities.    He was started on DAPT with asa 81 mg and plavix  75 mg daily for 3 weeks with plan of asa monotherapy thereafter. He is taking these. He was also started on atorvastatin  40 mg daily. He is not taking this due to prior joint pain. He does not think he can take a statin. He did not take atorvastatin . He is taking fenofibrate  for a while. LDL was 149 during recent hospitalization despite fenofibrate .   Since the hospitalization, he feels like he is getting stronger. He is walking with a walker. He feels his left arm and leg are weak, but getting stronger. He has already started PT.   He is a former smoker (quit ~1996). Prior to the stroke, he would drink 2-3 glasses of wine per night. He endorses occasionally smoking cigars.   He denies any constitutional symptoms like fevers, night sweats, or unintentional weight loss.  Most recent Assessment and Plan (06/07/23): Joseph Peterson is a 73  y.o. male who presents for evaluation of left sided weakness due to acute infarct (05/28/23). He has a relevant medical history of HTN, HLD, OSA on CPAP, DM, COPD, former smoker. His neurological examination is pertinent for mild left face droop and left arm and leg weakness. Available diagnostic data is significant for MRI brain showing acute infarct in right posterior limb of internal capsule. MRA showed Peterson significant stenosis or large vessel occlusion. The etiology of patient's stroke is likely small vessel disease due to HTN, HLD, and newly diagnosed DM. He apparently cannot take a statin but his LDL was 149 on fenofibrate  alone, so he likely needs something else for this. He will be finishing up 21 days of DAPT and is currently doing PT with improvement in strength.   PLAN: -Continue DAPT with aspirin  81 mg daily and plavix  75 mg daily for 21 days (~06/19/23) then stop plavix  and continue only aspirin  81 mg daily -Patient to speak to PCP about cholesterol and options for HLD control, with LDL goal < 70 -Patient to speak to PCP about new diagnosis of DM -Discussed cutting back and stopping cigar use and EtOH -Stroke warning signs discussed  Since their last visit: ***  MEDICATIONS:  Outpatient Encounter Medications as of 12/14/2023  Medication Sig   amLODipine  (NORVASC ) 10 MG tablet Take 10 mg by mouth daily.   aspirin  EC 81 MG tablet Take 1 tablet (81 mg total)  by mouth daily. Swallow whole.   atorvastatin  (LIPITOR) 40 MG tablet Take 1 tablet (40 mg total) by mouth daily. (Patient not taking: Reported on 06/07/2023)   clopidogrel  (PLAVIX ) 75 MG tablet Take 1 tablet (75 mg total) by mouth daily.   fenofibrate  160 MG tablet Take 160 mg by mouth daily.   hydrochlorothiazide  (HYDRODIURIL ) 25 MG tablet Take 25 mg by mouth daily.   Peterson facility-administered encounter medications on file as of 12/14/2023.    PAST MEDICAL HISTORY: Past Medical History:  Diagnosis Date   COPD (chronic obstructive  pulmonary disease) (HCC)    Hypercholesteremia    Hypertension    S/P TKR (total knee replacement) using cement, right 05/21/2018   Sleep apnea     PAST SURGICAL HISTORY: Past Surgical History:  Procedure Laterality Date   COLONOSCOPY N/A 05/26/2013   Procedure: COLONOSCOPY;  Surgeon: Lamar CHRISTELLA Hollingshead, MD;  [facility];  Service: Endoscopy;  Laterality: N/A;  1:00 PM-moved to [address] notified pt   COLONOSCOPY WITH PROPOFOL  N/A 04/08/2020   Procedure: COLONOSCOPY WITH PROPOFOL ;  Surgeon: Hollingshead Lamar CHRISTELLA, MD;  [facility];  Service: Endoscopy;  Laterality: N/A;  10:30am   POLYPECTOMY  04/08/2020   Procedure: POLYPECTOMY;  Surgeon: Hollingshead Lamar CHRISTELLA, MD;  [facility];  Service: Endoscopy;;   TOTAL KNEE ARTHROPLASTY Right 05/21/2018   Procedure: TOTAL KNEE ARTHROPLASTY-RIGHT;  Surgeon: Kathlynn Sharper, MD;  Location: [facility];  Service: Orthopedics;  Laterality: Right;    ALLERGIES: Allergies  Allergen Reactions   Atorvastatin      FAMILY HISTORY: Family History  Problem Relation Age of Onset   Heart Problems Mother    COPD Mother    Asthma Mother    Kidney failure Father    Colon cancer Neg Hx     SOCIAL HISTORY: Social History   Tobacco Use   Smoking status: Former   Smokeless tobacco: Former  Building services engineer status: Never Used  Substance Use Topics   Alcohol use: Not Currently    Comment: 2-3 beers daily not dranking in a week   Drug use: Never   Social History   Social History Narrative   Are you right handed or left handed? Right   Are you currently employed ?    What is your current occupation? retired   Do you live at home alone?   Who lives with you? Wife    What type of home do you live in: 1 story or 2 story? one    Caffiene 2 cups in AM      Objective:  Vital Signs:  There were Peterson vitals taken for this visit.  ***  Labs and Imaging review: Peterson new results***  Previously reviewed results: TSH (05/30/23)  wnl Lipid panel (05/29/23): tChol 221, LDL 149, TG 138 HbA1c (05/28/23): 6.5   Imaging/Procedures: MRI brain and MRA head contrast (05/28/23): IMPRESSION: MRI HEAD:   1. 8 mm acute infarct involving the posterior limb of the right internal capsule. 2. Mild chronic microvascular ischemic changes.   MRA HEAD:   1. Peterson large vessel occlusion, high-grade stenosis, or aneurysm.   Carotid ultrasound (05/29/23): IMPRESSION: Color duplex indicates minimal heterogeneous and calcified plaque, with Peterson hemodynamically significant stenosis by duplex criteria in the extracranial cerebrovascular circulation.   Echocardiogram (05/29/23): 1. Peterson evidence of LV thrombus by Definity . Left ventricular ejection  fraction, by estimation, is 70 to 75%. The left ventricle has hyperdynamic  function. The left ventricle  has Peterson regional wall motion abnormalities.  There is mild left ventricular  hypertrophy. Left ventricular diastolic parameters are consistent with  Grade I diastolic dysfunction (impaired relaxation).   2. Right ventricular systolic function is normal. The right ventricular  size is normal. Tricuspid regurgitation signal is inadequate for assessing  PA pressure.   3. The mitral valve is abnormal. Peterson evidence of mitral valve  regurgitation. Peterson evidence of mitral stenosis. Moderate mitral annular  calcification.   4. The aortic valve is tricuspid. Aortic valve regurgitation is not  visualized. Peterson aortic stenosis is present.   5. Aortic dilatation noted. There is mild dilatation of the aortic root,  measuring 41 mm.   6. Increased flow velocities may be secondary to anemia, thyrotoxicosis,  hyperdynamic or high flow state.  LA normal in size.  Assessment/Plan:  This is Joseph Peterson, a 73 y.o. male with: ***   Plan: ***  Return to clinic in ***  Total time spent reviewing records, interview, history/exam, documentation, and coordination of care on day of encounter:  *** min  Venetia Potters, MD

## 2023-12-14 ENCOUNTER — Ambulatory Visit: Admitting: Neurology

## 2024-01-04 ENCOUNTER — Encounter: Payer: Self-pay | Admitting: *Deleted

## 2024-01-04 NOTE — Progress Notes (Signed)
 JERMY COUPER                                          MRN: [id number]   01/04/2024   The [facility] Quality Team Specialist reviewed this patient medical record for the purposes of chart review for care gap closure. The following were reviewed: chart review for care gap closure-kidney health evaluation for diabetes:eGFR  and uACR.    [facility] Quality Team

## 2024-01-17 DIAGNOSIS — E119 Type 2 diabetes mellitus without complications: Secondary | ICD-10-CM | POA: Diagnosis not present

## 2024-01-17 DIAGNOSIS — Z79899 Other long term (current) drug therapy: Secondary | ICD-10-CM | POA: Diagnosis not present
